# Patient Record
Sex: Male | Born: 1960 | Race: White | Hispanic: No | Smoking: Never smoker
Health system: Southern US, Community
[De-identification: ages and names within clinical notes are randomized; demographics above are authoritative.]

## PROBLEM LIST (undated history)

## (undated) DIAGNOSIS — I4891 Unspecified atrial fibrillation: Secondary | ICD-10-CM

## (undated) DIAGNOSIS — I10 Essential (primary) hypertension: Secondary | ICD-10-CM

## (undated) DIAGNOSIS — T7840XA Allergy, unspecified, initial encounter: Secondary | ICD-10-CM

## (undated) HISTORY — DX: Essential (primary) hypertension: I10

## (undated) HISTORY — DX: Allergy, unspecified, initial encounter: T78.40XA

## (undated) HISTORY — PX: VASECTOMY: SHX75

## (undated) HISTORY — DX: Unspecified atrial fibrillation: I48.91

---

## 2007-03-16 ENCOUNTER — Ambulatory Visit: Payer: Self-pay | Admitting: Chiropractic Medicine

## 2008-04-20 ENCOUNTER — Ambulatory Visit: Payer: Self-pay | Admitting: General Practice

## 2010-02-18 ENCOUNTER — Emergency Department: Payer: Self-pay | Admitting: Internal Medicine

## 2013-05-30 ENCOUNTER — Ambulatory Visit: Payer: Self-pay | Admitting: Gastroenterology

## 2013-05-31 LAB — PATHOLOGY REPORT

## 2013-09-14 HISTORY — PX: CERVICAL FUSION: SHX112

## 2014-03-28 ENCOUNTER — Ambulatory Visit: Payer: Self-pay | Admitting: General Practice

## 2014-07-17 ENCOUNTER — Ambulatory Visit: Payer: Self-pay | Admitting: General Practice

## 2019-05-10 ENCOUNTER — Other Ambulatory Visit: Payer: Self-pay | Admitting: Internal Medicine

## 2019-05-10 ENCOUNTER — Encounter: Payer: Self-pay | Admitting: Internal Medicine

## 2019-05-10 DIAGNOSIS — J301 Allergic rhinitis due to pollen: Secondary | ICD-10-CM

## 2019-05-10 DIAGNOSIS — I10 Essential (primary) hypertension: Secondary | ICD-10-CM

## 2019-05-10 MED ORDER — METOPROLOL SUCCINATE ER 100 MG PO TB24: 100.0000 mg | ORAL_TABLET | Freq: Two times a day (BID) | ORAL | 0 refills | Status: DC

## 2019-05-10 NOTE — Telephone Encounter (Signed)
Patient last seen by Dr Cheryll Cockayne 03/08/2018 overdue followup.  Was ordered repeat labs and needs repeat BP.  Please verify if patient seeing another PCM or had not come in due to covid 19 pandemic.  Bridge refill 30 days provided today electronic Rx for his singulair and metoprolol until he can schedule follow up appt.

## 2019-08-15 ENCOUNTER — Other Ambulatory Visit: Payer: Self-pay | Admitting: Registered Nurse

## 2019-08-15 DIAGNOSIS — H524 Presbyopia: Secondary | ICD-10-CM | POA: Diagnosis not present

## 2019-08-24 NOTE — Telephone Encounter (Signed)
Bridge refill given last request this fall. Patient overdue for labs/BP check.  Please schedule patient appt or confirm if he has new PCM and forward request to new PCM.  I will bridge refill to his scheduled appt.  Paper chart review last appt with Dr Cheryll Cockayne 2019

## 2019-09-13 NOTE — Telephone Encounter (Signed)
Richardson Landry was scheduled for labs & physical early part of January 2021.  Called yesterday & had to reschedule. Labs = 10/05/2019 Physical = 10/05/2019  AMD

## 2019-09-13 NOTE — Telephone Encounter (Signed)
Noted bridge refill of medications given electronic rx to his pharmacy of choice until appt date.

## 2019-10-17 ENCOUNTER — Other Ambulatory Visit: Payer: Self-pay | Admitting: Registered Nurse

## 2019-10-17 ENCOUNTER — Encounter: Payer: Self-pay | Admitting: Registered Nurse

## 2019-10-17 ENCOUNTER — Other Ambulatory Visit: Payer: Self-pay

## 2019-10-17 ENCOUNTER — Encounter: Payer: Self-pay | Admitting: Family Medicine

## 2019-10-17 ENCOUNTER — Ambulatory Visit (INDEPENDENT_AMBULATORY_CARE_PROVIDER_SITE_OTHER): Payer: Managed Care, Other (non HMO) | Admitting: Family Medicine

## 2019-10-17 VITALS — BP 187/86 | HR 93 | Temp 97.5°F | Ht 68.0 in | Wt 179.0 lb

## 2019-10-17 DIAGNOSIS — I4891 Unspecified atrial fibrillation: Secondary | ICD-10-CM | POA: Diagnosis not present

## 2019-10-17 DIAGNOSIS — Z23 Encounter for immunization: Secondary | ICD-10-CM

## 2019-10-17 DIAGNOSIS — I10 Essential (primary) hypertension: Secondary | ICD-10-CM

## 2019-10-17 DIAGNOSIS — Z1211 Encounter for screening for malignant neoplasm of colon: Secondary | ICD-10-CM | POA: Diagnosis not present

## 2019-10-17 DIAGNOSIS — Z125 Encounter for screening for malignant neoplasm of prostate: Secondary | ICD-10-CM

## 2019-10-17 DIAGNOSIS — J309 Allergic rhinitis, unspecified: Secondary | ICD-10-CM

## 2019-10-17 DIAGNOSIS — Z0000 Encounter for general adult medical examination without abnormal findings: Secondary | ICD-10-CM

## 2019-10-17 LAB — IFOBT (OCCULT BLOOD): IFOBT: NEGATIVE

## 2019-10-17 MED ORDER — MONTELUKAST SODIUM 10 MG PO TABS: 10.0000 mg | ORAL_TABLET | Freq: Every day | ORAL | 3 refills | Status: DC

## 2019-10-17 MED ORDER — METOPROLOL SUCCINATE ER 100 MG PO TB24: 100.0000 mg | ORAL_TABLET | Freq: Two times a day (BID) | ORAL | 3 refills | Status: DC

## 2019-10-17 MED ORDER — AMLODIPINE BESYLATE 5 MG PO TABS: 5.0000 mg | ORAL_TABLET | Freq: Every day | ORAL | 0 refills | Status: DC

## 2019-10-17 NOTE — Telephone Encounter (Signed)
Noted refilled entered by Dr Rosanna Randy today singulair 10mg  po qhs #90 RF3 for patient.  This is duplicate request.

## 2019-10-17 NOTE — Progress Notes (Signed)
Patient: Gabriel Harvey, Male    DOB: 1961-05-10, 59 y.o.   MRN: [id number] Visit Date: 10/17/2019  Today's Provider: [name], MD   Chief Complaint  Patient presents with  . New Patient (Initial Visit)   Subjective:     Annual physical exam Gabriel Harvey is a 59 y.o. male who presents today for health maintenance and complete physical. He feels well. He reports he is not exercising. He reports he is sleeping well. 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  Is divorced.  He has 3 children ages 2925 and 39 with no grandchildren. He has had 1 colonoscopy in the past. ----------------------------------------------------------------- Patient needs refills on metoprolol and montelukast   Review of Systems  Constitutional: Negative.   HENT: Positive for congestion, dental problem, hearing loss, rhinorrhea, sinus pressure and tinnitus.   Eyes: Negative.   Respiratory: Positive for apnea.   Cardiovascular: Negative.   Gastrointestinal: Positive for abdominal distention.  Endocrine: Positive for polyuria.  Musculoskeletal: Positive for arthralgias, neck pain and neck stiffness.  Skin: Negative.   Allergic/Immunologic: Positive for environmental allergies.  Neurological: Positive for numbness.  Hematological: Bruises/bleeds easily.  Psychiatric/Behavioral: Negative.     Social History      He         Social History   Socioeconomic History  . Marital status: Divorced    Spouse name: Not on file  . Number of children: Not on file  . Years of education: Not on file  . Highest education level: Not on file  Occupational History  . Not on file  Tobacco Use  . Smoking status: Not on file  Substance and Sexual Activity  . Alcohol use: Not on file  . Drug use: Not on file  . Sexual activity: Not on file  Other Topics Concern  . Not on file  Social History Narrative  . Not on file   Social Determinants of Health   Financial  Resource Strain:   . Difficulty of Paying Living Expenses: Not on file  Food Insecurity:   . Worried About Charity fundraiser in the Last Year: Not on file  . Ran Out of Food in the Last Year: Not on file  Transportation Needs:   . Lack of Transportation (Medical): Not on file  . Lack of Transportation (Non-Medical): Not on file  Physical Activity:   . Days of Exercise per Week: Not on file  . Minutes of Exercise per Session: Not on file  Stress:   . Feeling of Stress : Not on file  Social Connections:   . Frequency of Communication with Friends and Family: Not on file  . Frequency of Social Gatherings with Friends and Family: Not on file  . Attends Religious Services: Not on file  . Active Member of Clubs or Organizations: Not on file  . Attends Archivist Meetings: Not on file  . Marital Status: Not on file    No past medical history on file.   There are no problems to display for this patient.   No past surgical history on file.  Family History        No family status information on file.        His family history is not on file.      Allergies  Allergen Reactions  . Augmentin [Amoxicillin-Pot Clavulanate] Other (See Comments)    headache  . Iodine     Child  allergy     Current Outpatient Medications:  .  metoprolol succinate (TOPROL-XL) 100 MG 24 hr tablet, TAKE ONE TABLET TWICE DAILY, Disp: 60 tablet, Rfl: 0 .  montelukast (SINGULAIR) 10 MG tablet, TAKE ONE TABLET AT BEDTIME, Disp: 30 tablet, Rfl: 0   Patient Care Team: Jerrol Banana., MD as PCP - General (Family Medicine)    Objective:    Vitals: There were no vitals taken for this visit.  There were no vitals filed for this visit.   Physical Exam Vitals reviewed.  HENT:     Head: Normocephalic and atraumatic.     Right Ear: External ear normal.     Left Ear: External ear normal.  Eyes:     General: No scleral icterus.    Conjunctiva/sclera: Conjunctivae normal.  Neck:      Vascular: No carotid bruit.  Cardiovascular:     Rate and Rhythm: Normal rate and regular rhythm.     Pulses: Normal pulses.     Heart sounds: Normal heart sounds.  Pulmonary:     Effort: Pulmonary effort is normal.     Breath sounds: Normal breath sounds.  Abdominal:     Palpations: Abdomen is soft.  Musculoskeletal:     Right lower leg: No edema.     Left lower leg: No edema.  Lymphadenopathy:     Cervical: No cervical adenopathy.  Skin:    General: Skin is warm and dry.  Neurological:     General: No focal deficit present.     Mental Status: He is alert and oriented to person, place, and time.  Psychiatric:        Mood and Affect: Mood normal.        Behavior: Behavior normal.        Thought Content: Thought content normal.        Judgment: Judgment normal.      Depression Screen No flowsheet data found.     Assessment & Plan:     Routine Health Maintenance and Physical Exam Refer for colonoscopy. Exercise Activities and Dietary recommendations Goals   None      There is no immunization history on file for this patient.  Health Maintenance  Topic Date Due  . Hepatitis C Screening  [birth year]  . HIV Screening  10/24/1973  . TETANUS/TDAP  10/24/1977  . COLONOSCOPY  10/24/2008  . INFLUENZA VACCINE  04/15/2019     Discussed health benefits of physical activity, and encouraged him to engage in regular exercise appropriate for his age and condition.   1. Annual physical exam   2. Atrial fibrillation, unspecified type ([place])  - EKG 12-Lead - CBC with Differential/Platelet - Comprehensive metabolic panel - TSH - Lipid panel  3. Need for Tdap vaccination  - EKG 12-Lead - CBC with Differential/Platelet - Comprehensive metabolic panel - TSH - Lipid panel  4. Hypertension, unspecified type Double dose of medication.  Turn to clinic 1 to 2 months - EKG 12-Lead - CBC with Differential/Platelet - Comprehensive metabolic panel - TSH - Lipid  panel - metoprolol succinate (TOPROL-XL) 100 MG 24 hr tablet; Take 1 tablet (100 mg total) by mouth 2 (two) times daily. Take with or immediately following a meal.  Dispense: 180 tablet; Refill: 3  5. Allergic rhinitis, unspecified seasonality, unspecified trigger  - EKG 12-Lead - CBC with Differential/Platelet - Comprehensive metabolic panel - TSH - Lipid panel - montelukast (SINGULAIR) 10 MG tablet; Take 1 tablet (10 mg total) by mouth at bedtime.  Dispense: 90 tablet; Refill: 3  6. Screening for prostate cancer  - EKG 12-Lead - CBC with Differential/Platelet - Comprehensive metabolic panel - TSH - Lipid panel - PSA  7. Encounter for screening fecal occult blood testing Refer for colonoscopy. - IFOBT POC (occult bld, rslt in office); Future - IFOBT POC (occult bld, rslt in office) 8.Status post cervical laminectomy --------------------------------------------------------------------    [name], MD  [facility]

## 2019-11-15 NOTE — Progress Notes (Deleted)
       Patient: Gabriel Harvey Male    DOB: 1960-09-27   59 y.o.   MRN: [id number] Visit Date: 11/15/2019  Today's Provider: [name], MD   No chief complaint on file.  Subjective:     HPI   Atrial fibrillation, unspecified type ([place]) From 10/17/2019-labs ordered, but zero were done.  Hypertension, unspecified type From 10/17/2019-labs ordered, but zero were done. Double dose of medication.  follow up in 1 to 2 months.   Allergies  Allergen Reactions  . Augmentin [Amoxicillin-Pot Clavulanate] Other (See Comments)    headache  . Iodine     Child allergy     Current Outpatient Medications:  .  amLODipine (NORVASC) 5 MG tablet, Take 1 tablet (5 mg total) by mouth daily., Disp: 30 tablet, Rfl: 0 .  aspirin EC 81 MG tablet, Take 81 mg by mouth daily. As needed, Disp: , Rfl:  .  diltiazem (CARDIZEM) 30 MG tablet, Take 30 mg by mouth 1 day or 1 dose., Disp: , Rfl:  .  fexofenadine (ALLEGRA) 180 MG tablet, Take 180 mg by mouth daily., Disp: , Rfl:  .  metoprolol succinate (TOPROL-XL) 100 MG 24 hr tablet, Take 1 tablet (100 mg total) by mouth 2 (two) times daily. Take with or immediately following a meal., Disp: 180 tablet, Rfl: 3 .  montelukast (SINGULAIR) 10 MG tablet, Take 1 tablet (10 mg total) by mouth at bedtime., Disp: 90 tablet, Rfl: 3  Review of Systems  Constitutional: Negative for appetite change, chills and fever.  Respiratory: Negative for chest tightness, shortness of breath and wheezing.   Cardiovascular: Negative for chest pain and palpitations.  Gastrointestinal: Negative for abdominal pain, nausea and vomiting.    Social History   Tobacco Use  . Smoking status: Never Smoker  . Smokeless tobacco: Current User    Types: Chew  Substance Use Topics  . Alcohol use: Yes    Alcohol/week: 2.0 standard drinks    Types: 2 Cans of beer per week    Comment: occasional       Objective:   There were no vitals taken for this visit. There were no vitals  filed for this visit.There is no height or weight on file to calculate BMI.   Physical Exam   No results found for any visits on 11/16/19.     Assessment & Plan        [name], MD  [facility]

## 2019-11-16 ENCOUNTER — Ambulatory Visit: Payer: Self-pay | Admitting: Family Medicine

## 2019-11-21 ENCOUNTER — Other Ambulatory Visit: Payer: Self-pay | Admitting: Family Medicine

## 2019-11-21 DIAGNOSIS — I4891 Unspecified atrial fibrillation: Secondary | ICD-10-CM | POA: Diagnosis not present

## 2019-11-21 DIAGNOSIS — I10 Essential (primary) hypertension: Secondary | ICD-10-CM | POA: Diagnosis not present

## 2019-11-21 DIAGNOSIS — J309 Allergic rhinitis, unspecified: Secondary | ICD-10-CM | POA: Diagnosis not present

## 2019-11-21 DIAGNOSIS — Z125 Encounter for screening for malignant neoplasm of prostate: Secondary | ICD-10-CM | POA: Diagnosis not present

## 2019-11-21 DIAGNOSIS — Z23 Encounter for immunization: Secondary | ICD-10-CM | POA: Diagnosis not present

## 2019-11-21 NOTE — Progress Notes (Signed)
Patient: Gabriel Harvey Male    DOB: 11-20-1960   59 y.o.   MRN: [id number] Visit Date: 11/23/2019  Today's Provider: [name], MD   Chief Complaint  Patient presents with  . Hypertension   Subjective:     HPI  Patient feels well.  Is tolerating amlodipine and metoprolol.  Overall he feels well.  Not exercising. Hypertension, unspecified type From 10/17/2019-Double dose of medication, metoprolol 100 mg. Follow up in 1 to 2 months. Labs ordered, but not done until 11/21/2019.   Allergies  Allergen Reactions  . Augmentin [Amoxicillin-Pot Clavulanate] Other (See Comments)    headache  . Iodine     Child allergy     Current Outpatient Medications:  .  amLODipine (NORVASC) 5 MG tablet, TAKE ONE TABLET EVERY DAY, Disp: 30 tablet, Rfl: 0 .  aspirin EC 81 MG tablet, Take 81 mg by mouth daily. As needed, Disp: , Rfl:  .  diltiazem (CARDIZEM) 30 MG tablet, Take 30 mg by mouth 1 day or 1 dose., Disp: , Rfl:  .  fexofenadine (ALLEGRA) 180 MG tablet, Take 180 mg by mouth daily., Disp: , Rfl:  .  metoprolol succinate (TOPROL-XL) 100 MG 24 hr tablet, Take 1 tablet (100 mg total) by mouth 2 (two) times daily. Take with or immediately following a meal., Disp: 180 tablet, Rfl: 3 .  montelukast (SINGULAIR) 10 MG tablet, Take 1 tablet (10 mg total) by mouth at bedtime., Disp: 90 tablet, Rfl: 3  Review of Systems  Constitutional: Negative for appetite change, chills and fever.  HENT: Negative.   Eyes: Negative.   Respiratory: Negative for chest tightness, shortness of breath and wheezing.   Cardiovascular: Negative for chest pain and palpitations.  Gastrointestinal: Negative for abdominal pain, nausea and vomiting.  Endocrine: Negative.   Musculoskeletal: Positive for arthralgias and back pain.  Allergic/Immunologic: Negative.   Neurological: Negative.   Hematological: Negative.   Psychiatric/Behavioral: Negative.     Social History   Tobacco Use  . Smoking status:  Never Smoker  . Smokeless tobacco: Current User    Types: Chew  Substance Use Topics  . Alcohol use: Yes    Alcohol/week: 2.0 standard drinks    Types: 2 Cans of beer per week    Comment: occasional       Objective:   BP (!) 157/86 (BP Location: Left Arm, Cuff Size: Large)   Pulse (!) 108   Temp (!) 97.2 F (36.2 C) (Temporal)   Resp 17   Wt 176 lb 3.2 oz (79.9 kg)   SpO2 96%   BMI 26.79 kg/m  Vitals:   11/23/19 1334 11/23/19 1340  BP: (!) 172/90 (!) 157/86  Pulse: (!) 111 (!) 108  Resp: 17   Temp: (!) 97.2 F (36.2 C)   TempSrc: Temporal   SpO2: 96%   Weight: 176 lb 3.2 oz (79.9 kg)   Body mass index is 26.79 kg/m.   Physical Exam Vitals reviewed.  HENT:     Head: Normocephalic and atraumatic.     Right Ear: External ear normal.     Left Ear: External ear normal.  Eyes:     General: No scleral icterus.    Conjunctiva/sclera: Conjunctivae normal.  Neck:     Vascular: No carotid bruit.  Cardiovascular:     Rate and Rhythm: Normal rate and regular rhythm.     Pulses: Normal pulses.     Heart sounds: Normal heart sounds.  Pulmonary:  Effort: Pulmonary effort is normal.     Breath sounds: Normal breath sounds.  Abdominal:     Palpations: Abdomen is soft.  Musculoskeletal:     Right lower leg: No edema.     Left lower leg: No edema.  Lymphadenopathy:     Cervical: No cervical adenopathy.  Skin:    General: Skin is warm and dry.  Neurological:     General: No focal deficit present.     Mental Status: He is alert and oriented to person, place, and time.  Psychiatric:        Mood and Affect: Mood normal.        Behavior: Behavior normal.        Thought Content: Thought content normal.        Judgment: Judgment normal.      No results found for any visits on 11/23/19.     Assessment & Plan    1. Hypertension, unspecified type Increase amlodipine from 5 mg to 10 mg daily. Return to clinic 2 to 3 months - amLODipine (NORVASC) 10 MG tablet;  Take 1 tablet (10 mg total) by mouth daily.  Dispense: 30 tablet; Refill: 3 2.Afib   Follow up in 2-3 months.      I,Claritza July,acting as a scribe for [name], MD.,have documented all relevant documentation on the behalf of [name], MD,as directed by  [name], MD while in the presence of [name], MD.     [name], MD  [facility]

## 2019-11-22 LAB — LIPID PANEL
Chol/HDL Ratio: 1.8 ratio (ref 0.0–5.0)
Cholesterol, Total: 168 mg/dL (ref 100–199)
HDL: 91 mg/dL (ref >39)
LDL Chol Calc (NIH): 66 mg/dL (ref 0–99)
Triglycerides: 54 mg/dL (ref 0–149)
VLDL Cholesterol Cal: 11 mg/dL (ref 5–40)

## 2019-11-22 LAB — CBC WITH DIFFERENTIAL/PLATELET
Basophils Absolute: 0 x10E3/uL (ref 0.0–0.2)
Basos: 1 %
EOS (ABSOLUTE): 0.1 x10E3/uL (ref 0.0–0.4)
Eos: 1 %
Hematocrit: 45.6 % (ref 37.5–51.0)
Hemoglobin: 15.6 g/dL (ref 13.0–17.7)
Immature Grans (Abs): 0 x10E3/uL (ref 0.0–0.1)
Immature Granulocytes: 1 %
Lymphocytes Absolute: 1.1 x10E3/uL (ref 0.7–3.1)
Lymphs: 21 %
MCH: 32 pg (ref 26.6–33.0)
MCHC: 34.2 g/dL (ref 31.5–35.7)
MCV: 94 fL (ref 79–97)
Monocytes Absolute: 0.6 x10E3/uL (ref 0.1–0.9)
Monocytes: 11 %
Neutrophils Absolute: 3.5 x10E3/uL (ref 1.4–7.0)
Neutrophils: 65 %
Platelets: 132 x10E3/uL — ABNORMAL LOW (ref 150–450)
RBC: 4.87 x10E6/uL (ref 4.14–5.80)
RDW: 11.6 % (ref 11.6–15.4)
WBC: 5.3 x10E3/uL (ref 3.4–10.8)

## 2019-11-22 LAB — COMPREHENSIVE METABOLIC PANEL
ALT: 49 IU/L — ABNORMAL HIGH (ref 0–44)
AST: 63 IU/L — ABNORMAL HIGH (ref 0–40)
Albumin/Globulin Ratio: 1.6 (ref 1.2–2.2)
Albumin: 4.2 g/dL (ref 3.8–4.8)
Alkaline Phosphatase: 104 IU/L (ref 39–117)
BUN/Creatinine Ratio: 7 — ABNORMAL LOW (ref 10–24)
BUN: 6 mg/dL — ABNORMAL LOW (ref 8–27)
Bilirubin Total: 0.8 mg/dL (ref 0.0–1.2)
CO2: 23 mmol/L (ref 20–29)
Calcium: 8.9 mg/dL (ref 8.6–10.2)
Chloride: 93 mmol/L — ABNORMAL LOW (ref 96–106)
Creatinine, Ser: 0.9 mg/dL (ref 0.76–1.27)
GFR calc Af Amer: 106 mL/min/{1.73_m2} (ref >59)
GFR calc non Af Amer: 92 mL/min/{1.73_m2} (ref >59)
Globulin, Total: 2.7 g/dL (ref 1.5–4.5)
Glucose: 108 mg/dL — ABNORMAL HIGH (ref 65–99)
Potassium: 3.9 mmol/L (ref 3.5–5.2)
Sodium: 135 mmol/L (ref 134–144)
Total Protein: 6.9 g/dL (ref 6.0–8.5)

## 2019-11-22 LAB — PSA: Prostate Specific Ag, Serum: 1.4 ng/mL (ref 0.0–4.0)

## 2019-11-22 LAB — TSH: TSH: 3.74 u[IU]/mL (ref 0.450–4.500)

## 2019-11-23 ENCOUNTER — Ambulatory Visit (INDEPENDENT_AMBULATORY_CARE_PROVIDER_SITE_OTHER): Payer: 59 | Admitting: Family Medicine

## 2019-11-23 ENCOUNTER — Encounter: Payer: Self-pay | Admitting: Family Medicine

## 2019-11-23 ENCOUNTER — Other Ambulatory Visit: Payer: Self-pay

## 2019-11-23 VITALS — BP 157/86 | HR 108 | Temp 97.2°F | Resp 17 | Wt 176.2 lb

## 2019-11-23 DIAGNOSIS — I10 Essential (primary) hypertension: Secondary | ICD-10-CM | POA: Diagnosis not present

## 2019-11-23 DIAGNOSIS — I4891 Unspecified atrial fibrillation: Secondary | ICD-10-CM | POA: Diagnosis not present

## 2019-11-23 MED ORDER — AMLODIPINE BESYLATE 10 MG PO TABS: 10.0000 mg | ORAL_TABLET | Freq: Every day | ORAL | 3 refills | Status: DC

## 2020-01-18 NOTE — Progress Notes (Signed)
Established patient visit   Patient: Gabriel Harvey   DOB: 1961/07/05   59 y.o. Male  MRN: [id number] Visit Date: 01/23/2020  Today's healthcare provider: [name], MD   Chief Complaint  Patient presents with  . Follow-up  . Hypertension   Subjective    HPI  Hypertension, follow-up  BP Readings from Last 3 Encounters:  01/23/20 (!) 155/82  11/23/19 (!) 157/86  10/17/19 (!) 187/86   Wt Readings from Last 3 Encounters:  01/23/20 174 lb (78.9 kg)  11/23/19 176 lb 3.2 oz (79.9 kg)  10/17/19 179 lb (81.2 kg)     He was last seen for hypertension 2 months ago.  BP at that visit was 157/86. Management since that visit includes ;Increased amlodipine from 5 mg to 10 mg daily.  He reports good compliance with treatment. He is not having side effects. none He some exercising. He is not adherent to low salt diet.   Outside blood pressures are not checking.  He does not smoke.  Use of agents associated with hypertension: NSAIDS.   --------------------------------------------------------------------       Medications: Outpatient Medications Prior to Visit  Medication Sig  . amLODipine (NORVASC) 10 MG tablet Take 1 tablet (10 mg total) by mouth daily.  Marland Kitchen aspirin EC 81 MG tablet Take 81 mg by mouth daily. As needed  . diltiazem (CARDIZEM) 30 MG tablet Take 30 mg by mouth 1 day or 1 dose.  . fexofenadine (ALLEGRA) 180 MG tablet Take 180 mg by mouth daily.  . metoprolol succinate (TOPROL-XL) 100 MG 24 hr tablet Take 1 tablet (100 mg total) by mouth 2 (two) times daily. Take with or immediately following a meal.  . montelukast (SINGULAIR) 10 MG tablet Take 1 tablet (10 mg total) by mouth at bedtime.   No facility-administered medications prior to visit.    Review of Systems  Constitutional: Negative for appetite change, chills and fever.  Eyes: Negative.   Respiratory: Negative for chest tightness, shortness of breath and wheezing.   Cardiovascular:  Negative for chest pain and palpitations.  Gastrointestinal: Negative for abdominal pain, nausea and vomiting.  Endocrine: Negative.   Musculoskeletal: Positive for back pain.  Allergic/Immunologic: Negative.   Neurological: Negative.   Hematological: Negative.   Psychiatric/Behavioral: Negative.        Objective    BP (!) 155/82 (BP Location: Right Arm, Patient Position: Sitting, Cuff Size: Large)   Pulse 92   Temp 97.7 F (36.5 C) (Other (Comment))   Resp 18   Ht 5\' 8"  (1.727 m)   Wt 174 lb (78.9 kg)   SpO2 96%   BMI 26.46 kg/m  BP Readings from Last 3 Encounters:  01/23/20 (!) 155/82  11/23/19 (!) 157/86  10/17/19 (!) 187/86   Wt Readings from Last 3 Encounters:  01/23/20 174 lb (78.9 kg)  11/23/19 176 lb 3.2 oz (79.9 kg)  10/17/19 179 lb (81.2 kg)      Physical Exam Vitals reviewed.  HENT:     Head: Normocephalic and atraumatic.     Right Ear: External ear normal.     Left Ear: External ear normal.  Eyes:     General: No scleral icterus.    Conjunctiva/sclera: Conjunctivae normal.  Neck:     Vascular: No carotid bruit.  Cardiovascular:     Rate and Rhythm: Normal rate and regular rhythm.     Pulses: Normal pulses.     Heart sounds: Normal heart sounds.  Pulmonary:  Effort: Pulmonary effort is normal.     Breath sounds: Normal breath sounds.  Abdominal:     Palpations: Abdomen is soft.  Musculoskeletal:     Right lower leg: No edema.     Left lower leg: No edema.  Lymphadenopathy:     Cervical: No cervical adenopathy.  Skin:    General: Skin is warm and dry.  Neurological:     General: No focal deficit present.     Mental Status: He is alert and oriented to person, place, and time.  Psychiatric:        Mood and Affect: Mood normal.        Behavior: Behavior normal.        Thought Content: Thought content normal.        Judgment: Judgment normal.       No results found for any visits on 01/23/20.  Assessment & Plan     1. Essential  hypertension He should work on exercise. Get blood pressure readings from home and reassess in 2 months.  2. Atrial fibrillation, unspecified type ([place]) As needed diltiazem.   No follow-ups on file.      I, [name], MD, have reviewed all documentation for this visit. The documentation on 01/28/20 for the exam, diagnosis, procedures, and orders are all accurate and complete.    Taviana Westergren Cranford Mon, MD  [facility] [phone] (phone) [phone] (fax)  [place]

## 2020-01-23 ENCOUNTER — Ambulatory Visit (INDEPENDENT_AMBULATORY_CARE_PROVIDER_SITE_OTHER): Payer: 59 | Admitting: Family Medicine

## 2020-01-23 ENCOUNTER — Other Ambulatory Visit: Payer: Self-pay

## 2020-01-23 ENCOUNTER — Encounter: Payer: Self-pay | Admitting: Family Medicine

## 2020-01-23 VITALS — BP 155/82 | HR 92 | Temp 97.7°F | Resp 18 | Ht 68.0 in | Wt 174.0 lb

## 2020-01-23 DIAGNOSIS — I10 Essential (primary) hypertension: Secondary | ICD-10-CM | POA: Diagnosis not present

## 2020-01-23 DIAGNOSIS — I4891 Unspecified atrial fibrillation: Secondary | ICD-10-CM | POA: Diagnosis not present

## 2020-03-19 ENCOUNTER — Other Ambulatory Visit: Payer: Self-pay | Admitting: Family Medicine

## 2020-03-19 DIAGNOSIS — I10 Essential (primary) hypertension: Secondary | ICD-10-CM

## 2020-03-19 NOTE — Telephone Encounter (Signed)
Requested Prescriptions  Pending Prescriptions Disp Refills  . amLODipine (NORVASC) 10 MG tablet [Pharmacy Med Name: AMLODIPINE BESYLATE 10 MG TAB] 90 tablet 1    Sig: TAKE 1 TABLET BY MOUTH DAILY     Cardiovascular:  Calcium Channel Blockers Failed - 03/19/2020  7:03 PM      Failed - Last BP in normal range    BP Readings from Last 1 Encounters:  01/23/20 (!) 155/82         Passed - Valid encounter within last 6 months    Recent Outpatient Visits          1 month ago Essential hypertension   [facility] Jerrol Banana., MD   3 months ago Hypertension, unspecified type   [facility] Jerrol Banana., MD   5 months ago Annual physical exam   [facility] Jerrol Banana., MD      Future Appointments            In 1 month Jerrol Banana., MD [facility], [place]

## 2020-04-23 NOTE — Progress Notes (Signed)
I,[name],acting as a scribe for Gabriel Durie, MD.,have documented all relevant documentation on the behalf of Gabriel Durie, MD,as directed by  Gabriel Durie, MD while in the presence of Gabriel Durie, MD.   Established patient visit   Patient: Gabriel Harvey   DOB: Sep 23, 1960   59 y.o. Male  MRN: [id number] Visit Date: 04/24/2020  Today's healthcare provider: [name], MD   Chief Complaint  Patient presents with  . Follow-up  . Hypertension   Subjective    HPI  Patient is taking his medications as prescribed.  He is not exercising regularly. Hypertension, follow-up  BP Readings from Last 3 Encounters:  04/24/20 (!) 155/88  01/23/20 (!) 155/82  11/23/19 (!) 157/86   Wt Readings from Last 3 Encounters:  04/24/20 175 lb (79.4 kg)  01/23/20 174 lb (78.9 kg)  11/23/19 176 lb 3.2 oz (79.9 kg)     He was last seen for hypertension 3 months ago.  BP at that visit was 155/82. Management since that visit includes; He should work on exercise. Get blood pressure readings from home and reassess in 2 months. He reports good compliance with treatment. He is not having side effects. none He is exercising. He is adherent to low salt diet.   Outside blood pressures are 120's/70's-140's/90's.  He does not smoke.  Use of agents associated with hypertension: none.   --------------------------------------------------------------------  Atrial fibrillation, unspecified type ([place]) From 01/23/2020-As needed diltiazem.       Medications: Outpatient Medications Prior to Visit  Medication Sig  . amLODipine (NORVASC) 10 MG tablet TAKE 1 TABLET BY MOUTH DAILY  . aspirin EC 81 MG tablet Take 81 mg by mouth daily. As needed  . diltiazem (CARDIZEM) 30 MG tablet Take 30 mg by mouth 1 day or 1 dose.  . fexofenadine (ALLEGRA) 180 MG tablet Take 180 mg by mouth daily.  . metoprolol succinate (TOPROL-XL) 100 MG 24 hr tablet Take 1 tablet (100 mg total)  by mouth 2 (two) times daily. Take with or immediately following a meal.  . montelukast (SINGULAIR) 10 MG tablet Take 1 tablet (10 mg total) by mouth at bedtime.   No facility-administered medications prior to visit.    Review of Systems  Constitutional: Negative for appetite change, chills and fever.  Respiratory: Negative for chest tightness, shortness of breath and wheezing.   Cardiovascular: Negative for chest pain and palpitations.  Gastrointestinal: Negative for abdominal pain, nausea and vomiting.       Objective    BP (!) 155/88 (BP Location: Right Arm, Patient Position: Sitting, Cuff Size: Large)   Pulse 84   Temp 98.6 F (37 C) (Other (Comment))   Resp 18   Ht 5\' 8"  (1.727 m)   Wt 175 lb (79.4 kg)   SpO2 95%   BMI 26.61 kg/m  BP Readings from Last 3 Encounters:  04/24/20 (!) 155/88  01/23/20 (!) 155/82  11/23/19 (!) 157/86   Wt Readings from Last 3 Encounters:  04/24/20 175 lb (79.4 kg)  01/23/20 174 lb (78.9 kg)  11/23/19 176 lb 3.2 oz (79.9 kg)      Physical Exam Vitals reviewed.  HENT:     Head: Normocephalic and atraumatic.     Right Ear: External ear normal.     Left Ear: External ear normal.  Eyes:     General: No scleral icterus.    Conjunctiva/sclera: Conjunctivae normal.  Neck:     Vascular: No carotid bruit.  Cardiovascular:     Rate and Rhythm: Normal rate and regular rhythm.     Pulses: Normal pulses.     Heart sounds: Normal heart sounds.  Pulmonary:     Effort: Pulmonary effort is normal.     Breath sounds: Normal breath sounds.  Abdominal:     Palpations: Abdomen is soft.  Musculoskeletal:     Right lower leg: No edema.     Left lower leg: No edema.  Lymphadenopathy:     Cervical: No cervical adenopathy.  Skin:    General: Skin is warm and dry.  Neurological:     General: No focal deficit present.     Mental Status: He is alert and oriented to person, place, and time.  Psychiatric:        Mood and Affect: Mood normal.         Behavior: Behavior normal.        Thought Content: Thought content normal.        Judgment: Judgment normal.     BP (!) 155/88 (BP Location: Right Arm, Patient Position: Sitting, Cuff Size: Large)   Pulse 84   Temp 98.6 F (37 C) (Other (Comment))   Resp 18   Ht 5\' 8"  (1.727 m)   Wt 175 lb (79.4 kg)   SpO2 95%   BMI 26.61 kg/m   General Appearance:    Alert, cooperative, no distress, appears stated age  Head:    Normocephalic, without obvious abnormality, atraumatic  Eyes:    PERRL, conjunctiva/corneas clear, EOM's intact, fundi    benign, both eyes       Ears:    Normal TM's and external ear canals, both ears  Nose:   Nares normal, septum midline, mucosa normal, no drainage   or sinus tenderness  Throat:   Lips, mucosa, and tongue normal; teeth and gums normal  Neck:   Supple, symmetrical, trachea midline, no adenopathy;       thyroid:  No enlargement/tenderness/nodules; no carotid   bruit or JVD  Back:     Symmetric, no curvature, ROM normal, no CVA tenderness  Lungs:     Clear to auscultation bilaterally, respirations unlabored  Chest wall:    No tenderness or deformity  Heart:    Regular rate and rhythm, S1 and S2 normal, no murmur, rub   or gallop  Abdomen:     Soft, non-tender, bowel sounds active all four quadrants,    no masses, no organomegaly  Genitalia:    Normal male without lesion, discharge or tenderness  Rectal:    Normal tone, normal prostate, no masses or tenderness;   guaiac negative stool  Extremities:   Extremities normal, atraumatic, no cyanosis or edema  Pulses:   2+ and symmetric all extremities  Skin:   Skin color, texture, turgor normal, no rashes or lesions  Lymph nodes:   Cervical, supraclavicular, and axillary nodes normal  Neurologic:   CNII-XII intact. Normal strength, sensation and reflexes      throughout     No results found for any visits on 04/24/20.  Assessment & Plan     1. Essential hypertension Work on diet and exercise and  check blood pressure readings at home and follow-up with 3 months.   Return in about 3 months (around 07/25/2020).         Lavere Stork Cranford Mon, MD  [facility] [phone] (phone) [phone] (fax)  [place]

## 2020-04-24 ENCOUNTER — Ambulatory Visit (INDEPENDENT_AMBULATORY_CARE_PROVIDER_SITE_OTHER): Payer: 59 | Admitting: Family Medicine

## 2020-04-24 ENCOUNTER — Other Ambulatory Visit: Payer: Self-pay

## 2020-04-24 ENCOUNTER — Encounter: Payer: Self-pay | Admitting: Family Medicine

## 2020-04-24 VITALS — BP 155/88 | HR 84 | Temp 98.6°F | Resp 18 | Ht 68.0 in | Wt 175.0 lb

## 2020-04-24 DIAGNOSIS — I4891 Unspecified atrial fibrillation: Secondary | ICD-10-CM

## 2020-04-24 DIAGNOSIS — I10 Essential (primary) hypertension: Secondary | ICD-10-CM | POA: Diagnosis not present

## 2020-07-30 ENCOUNTER — Ambulatory Visit: Payer: Self-pay | Admitting: Family Medicine

## 2020-07-30 NOTE — Progress Notes (Deleted)
     Established patient visit   Patient: Gabriel Harvey   DOB: 03/30/1961   59 y.o. Male  MRN: [id number] Visit Date: 07/30/2020  Today's healthcare provider: [name], MD   No chief complaint on file.  Subjective    HPI  Hypertension, follow-up  BP Readings from Last 3 Encounters:  04/24/20 (!) 155/88  01/23/20 (!) 155/82  11/23/19 (!) 157/86   Wt Readings from Last 3 Encounters:  04/24/20 175 lb (79.4 kg)  01/23/20 174 lb (78.9 kg)  11/23/19 176 lb 3.2 oz (79.9 kg)     He was last seen for hypertension 3 months ago.  BP at that visit was 155/88. Management since that visit includes; Work on diet and exercise and check blood pressure readings at home and follow-up with 3 months. He reports {excellent/good/fair/poor:19665} compliance with treatment. He {is/is not:9024} having side effects. {document side effects if present:1} He {is/is not:9024} exercising. He {is/is not:9024} adherent to low salt diet.   Outside blood pressures are {enter patient reported home BP, or 'not being checked':1}.  He {does/does not:200015} smoke.  Use of agents associated with hypertension: {bp agents assoc with hypertension:511::"none"}.   ---------------------------------------------------------------------------------------------------   {Show patient history (optional):23778::" "}   Medications: Outpatient Medications Prior to Visit  Medication Sig  . amLODipine (NORVASC) 10 MG tablet TAKE 1 TABLET BY MOUTH DAILY  . aspirin EC 81 MG tablet Take 81 mg by mouth daily. As needed  . diltiazem (CARDIZEM) 30 MG tablet Take 30 mg by mouth 1 day or 1 dose.  . fexofenadine (ALLEGRA) 180 MG tablet Take 180 mg by mouth daily.  . metoprolol succinate (TOPROL-XL) 100 MG 24 hr tablet Take 1 tablet (100 mg total) by mouth 2 (two) times daily. Take with or immediately following a meal.  . montelukast (SINGULAIR) 10 MG tablet Take 1 tablet (10 mg total) by mouth at bedtime.   No  facility-administered medications prior to visit.    Review of Systems  Constitutional: Negative for appetite change, chills and fever.  Respiratory: Negative for chest tightness, shortness of breath and wheezing.   Cardiovascular: Negative for chest pain and palpitations.  Gastrointestinal: Negative for abdominal pain, nausea and vomiting.    {Heme  Chem  Endocrine  Serology  Results Review (optional):23779::" "}  Objective    There were no vitals taken for this visit. {Show previous vital signs (optional):23777::" "}  Physical Exam  ***  No results found for any visits on 07/30/20.  Assessment & Plan     ***  No follow-ups on file.      {provider attestation***:1}   [name], MD  [facility] [phone] (phone) [phone] (fax)  [place]

## 2020-08-16 DIAGNOSIS — L570 Actinic keratosis: Secondary | ICD-10-CM | POA: Diagnosis not present

## 2020-08-16 DIAGNOSIS — L718 Other rosacea: Secondary | ICD-10-CM | POA: Diagnosis not present

## 2020-10-22 ENCOUNTER — Other Ambulatory Visit: Payer: Self-pay | Admitting: Family Medicine

## 2020-10-22 DIAGNOSIS — I10 Essential (primary) hypertension: Secondary | ICD-10-CM

## 2020-10-23 ENCOUNTER — Other Ambulatory Visit: Payer: Self-pay | Admitting: Family Medicine

## 2020-10-23 DIAGNOSIS — I10 Essential (primary) hypertension: Secondary | ICD-10-CM

## 2020-11-01 ENCOUNTER — Other Ambulatory Visit: Payer: Self-pay | Admitting: Family Medicine

## 2020-11-01 DIAGNOSIS — I10 Essential (primary) hypertension: Secondary | ICD-10-CM

## 2020-11-01 NOTE — Telephone Encounter (Signed)
Patient called and advised he is past due for his f/u as noted last OV note, patient verbalized understanding. Appointment scheduled for Thursday, 11/14/20 at 1320.

## 2020-11-01 NOTE — Telephone Encounter (Signed)
Requested medication (s) are due for refill today: Yes  Requested medication (s) are on the active medication list: Yes  Last refill:  10/17/19  Future visit scheduled: Yes  Notes to clinic:  Unable to refill per protocol, Rx expired.      Requested Prescriptions  Pending Prescriptions Disp Refills   metoprolol succinate (TOPROL-XL) 100 MG 24 hr tablet [Pharmacy Med Name: METOPROLOL SUCCINATE ER 100 MG TAB] 180 tablet 3    Sig: TAKE 1 TABLET BY MOUTH TWICE DAILY . TAKE WITH OR IMMEDIATELY FOLLOWING A MEAL.      Cardiovascular:  Beta Blockers Failed - 11/01/2020  4:07 PM      Failed - Last BP in normal range    BP Readings from Last 1 Encounters:  04/24/20 (!) 155/88          Failed - Valid encounter within last 6 months    Recent Outpatient Visits           6 months ago Essential hypertension   [facility] Jerrol Banana., MD   9 months ago Essential hypertension   [facility] Jerrol Banana., MD   11 months ago Hypertension, unspecified type   [facility] Jerrol Banana., MD   1 year ago Annual physical exam   [facility] Jerrol Banana., MD       Future Appointments             In 1 week Jerrol Banana., MD [facility]             Passed - Last Heart Rate in normal range    Pulse Readings from Last 1 Encounters:  04/24/20 84

## 2020-11-01 NOTE — Telephone Encounter (Signed)
Pt called and says he has already made an OV for march 3rd to qualify for metoprolol Rx refill.

## 2020-11-13 NOTE — Progress Notes (Signed)
I,[name],acting as a scribe for Gabriel Durie, MD.,have documented all relevant documentation on the behalf of Gabriel Durie, MD,as directed by  Gabriel Durie, MD while in the presence of Gabriel Durie, MD.   Established patient visit   Patient: Gabriel Harvey   DOB: 12-24-1960   60 y.o. Male  MRN: [id number] Visit Date: 11/14/2020  Today's healthcare provider: [name], MD   Chief Complaint  Patient presents with  . Follow-up  . Hypertension   Subjective    HPI  Patient feels well but has not taken his blood pressure at home. He has a main complaint today of ED which he thinks is worse since starting amlodipine. Hypertension, follow-up  BP Readings from Last 3 Encounters:  11/14/20 (!) 154/82  04/24/20 (!) 155/88  01/23/20 (!) 155/82   Wt Readings from Last 3 Encounters:  11/14/20 177 lb (80.3 kg)  04/24/20 175 lb (79.4 kg)  01/23/20 174 lb (78.9 kg)     He was last seen for hypertension 7 months ago.  BP at that visit was 155/88. Management since that visit includes; Work on diet and exercise and check blood pressure readings at home and follow-up with 3 months. He reports good compliance with treatment. He is not having side effects. none He is exercising. He is adherent to low salt diet.   Outside blood pressures are up and down.  He does not smoke.  Use of agents associated with hypertension: none.   --------------------------------------------------------------------       Medications: Outpatient Medications Prior to Visit  Medication Sig  . amLODipine (NORVASC) 10 MG tablet TAKE 1 TABLET BY MOUTH DAILY  . aspirin EC 81 MG tablet Take 81 mg by mouth daily. As needed  . diltiazem (CARDIZEM) 30 MG tablet Take 30 mg by mouth 1 day or 1 dose.  . fexofenadine (ALLEGRA) 180 MG tablet Take 180 mg by mouth daily.  . metoprolol succinate (TOPROL-XL) 100 MG 24 hr tablet Take 1 tablet (100 mg total) by mouth 2 (two) times  daily. Take with or immediately following a meal.  . montelukast (SINGULAIR) 10 MG tablet Take 1 tablet (10 mg total) by mouth at bedtime.   No facility-administered medications prior to visit.    Review of Systems  Constitutional: Negative for appetite change, chills and fever.  Respiratory: Negative for chest tightness, shortness of breath and wheezing.   Cardiovascular: Negative for chest pain and palpitations.  Gastrointestinal: Negative for abdominal pain, nausea and vomiting.        Objective    BP (!) 154/82 (BP Location: Right Arm, Patient Position: Sitting, Cuff Size: Large)   Pulse 95   Temp 98.7 F (37.1 C) (Oral)   Resp 16   Ht 5\' 8"  (1.727 m)   Wt 177 lb (80.3 kg)   SpO2 95%   BMI 26.91 kg/m  BP Readings from Last 3 Encounters:  11/14/20 (!) 154/82  04/24/20 (!) 155/88  01/23/20 (!) 155/82   Wt Readings from Last 3 Encounters:  11/14/20 177 lb (80.3 kg)  04/24/20 175 lb (79.4 kg)  01/23/20 174 lb (78.9 kg)       Physical Exam Vitals reviewed.  HENT:     Head: Normocephalic and atraumatic.     Right Ear: External ear normal.     Left Ear: External ear normal.  Eyes:     General: No scleral icterus.    Conjunctiva/sclera: Conjunctivae normal.  Neck:  Vascular: No carotid bruit.  Cardiovascular:     Rate and Rhythm: Normal rate and regular rhythm.     Pulses: Normal pulses.     Heart sounds: Normal heart sounds.  Pulmonary:     Effort: Pulmonary effort is normal.     Breath sounds: Normal breath sounds.  Abdominal:     Palpations: Abdomen is soft.  Musculoskeletal:     Right lower leg: No edema.     Left lower leg: No edema.  Lymphadenopathy:     Cervical: No cervical adenopathy.  Skin:    General: Skin is warm and dry.  Neurological:     General: No focal deficit present.     Mental Status: He is alert and oriented to person, place, and time.  Psychiatric:        Mood and Affect: Mood normal.        Behavior: Behavior normal.         Thought Content: Thought content normal.        Judgment: Judgment normal.       No results found for any visits on 11/14/20.  Assessment & Plan     1. Essential hypertension Discontinue amlodipine 10 mg. Start chlorthalidone 25 mg daily. - chlorthalidone (HYGROTON) 25 MG tablet; Take 1 tablet (25 mg total) by mouth daily.  Dispense: 30 tablet; Refill: 2   Return in about 1 month (around 12/15/2020).      I, Gabriel Durie, MD, have reviewed all documentation for this visit. The documentation on 11/15/20 for the exam, diagnosis, procedures, and orders are all accurate and complete.    Taquita Demby Cranford Mon, MD  [facility] [phone] (phone) [phone] (fax)  [place]

## 2020-11-14 ENCOUNTER — Ambulatory Visit (INDEPENDENT_AMBULATORY_CARE_PROVIDER_SITE_OTHER): Payer: 59 | Admitting: Family Medicine

## 2020-11-14 ENCOUNTER — Other Ambulatory Visit: Payer: Self-pay

## 2020-11-14 ENCOUNTER — Encounter: Payer: Self-pay | Admitting: Family Medicine

## 2020-11-14 VITALS — BP 154/82 | HR 95 | Temp 98.7°F | Resp 16 | Ht 68.0 in | Wt 177.0 lb

## 2020-11-14 DIAGNOSIS — I10 Essential (primary) hypertension: Secondary | ICD-10-CM

## 2020-11-14 MED ORDER — CHLORTHALIDONE 25 MG PO TABS: 25.0000 mg | ORAL_TABLET | Freq: Every day | ORAL | 2 refills | Status: DC

## 2020-11-14 NOTE — Patient Instructions (Signed)
Discontinue amlodipine 10 mg. Start chlorthalidone 25 mg daily.

## 2020-11-18 ENCOUNTER — Other Ambulatory Visit: Payer: Self-pay | Admitting: Family Medicine

## 2020-11-18 DIAGNOSIS — I10 Essential (primary) hypertension: Secondary | ICD-10-CM

## 2020-11-18 NOTE — Telephone Encounter (Signed)
Requested medication (s) are due for refill today: Yes  Requested medication (s) are on the active medication list: Yes  Last refill:  10/17/19  Future visit scheduled: Yes  Notes to clinic:  Unable to refill per protocol, Rx expired.      Requested Prescriptions  Pending Prescriptions Disp Refills   metoprolol succinate (TOPROL-XL) 100 MG 24 hr tablet [Pharmacy Med Name: METOPROLOL SUCCINATE ER 100 MG TAB] 180 tablet 3    Sig: TAKE 1 TABLET BY MOUTH TWICE DAILY . TAKE WITH OR IMMEDIATELY FOLLOWING A MEAL.      Cardiovascular:  Beta Blockers Failed - 11/18/2020  4:07 PM      Failed - Last BP in normal range    BP Readings from Last 1 Encounters:  11/14/20 (!) 154/82          Passed - Last Heart Rate in normal range    Pulse Readings from Last 1 Encounters:  11/14/20 95          Passed - Valid encounter within last 6 months    Recent Outpatient Visits           4 days ago Essential hypertension   [facility] Jerrol Banana., MD   6 months ago Essential hypertension   [facility] Jerrol Banana., MD   10 months ago Essential hypertension   [facility] Jerrol Banana., MD   12 months ago Hypertension, unspecified type   [facility] Jerrol Banana., MD   1 year ago Annual physical exam   [facility] Jerrol Banana., MD       Future Appointments             In 1 month Jerrol Banana., MD [facility] [place]

## 2020-11-26 ENCOUNTER — Other Ambulatory Visit: Payer: Self-pay | Admitting: Family Medicine

## 2020-11-26 DIAGNOSIS — I10 Essential (primary) hypertension: Secondary | ICD-10-CM

## 2021-01-16 ENCOUNTER — Other Ambulatory Visit: Payer: Self-pay

## 2021-01-16 ENCOUNTER — Encounter: Payer: Self-pay | Admitting: Family Medicine

## 2021-01-16 ENCOUNTER — Ambulatory Visit (INDEPENDENT_AMBULATORY_CARE_PROVIDER_SITE_OTHER): Payer: 59 | Admitting: Family Medicine

## 2021-01-16 VITALS — BP 158/89 | HR 87 | Resp 16 | Ht 68.0 in | Wt 169.0 lb

## 2021-01-16 DIAGNOSIS — Z0000 Encounter for general adult medical examination without abnormal findings: Secondary | ICD-10-CM

## 2021-01-16 DIAGNOSIS — I10 Essential (primary) hypertension: Secondary | ICD-10-CM | POA: Diagnosis not present

## 2021-01-16 DIAGNOSIS — I4891 Unspecified atrial fibrillation: Secondary | ICD-10-CM | POA: Diagnosis not present

## 2021-01-16 DIAGNOSIS — Z125 Encounter for screening for malignant neoplasm of prostate: Secondary | ICD-10-CM

## 2021-01-16 DIAGNOSIS — Z1211 Encounter for screening for malignant neoplasm of colon: Secondary | ICD-10-CM

## 2021-01-16 NOTE — Progress Notes (Signed)
I,Gabriel Harvey,acting as a scribe for Gabriel Durie, MD.,have documented all relevant documentation on the behalf of Gabriel Durie, MD,as directed by  Gabriel Durie, MD while in the presence of Gabriel Durie, MD.   Established patient visit   Patient: Gabriel Harvey   DOB: 1961-05-05   60 y.o. Male  MRN: [id number] Visit Date: 01/16/2021  Today's healthcare provider: [name], MD   Chief Complaint  Patient presents with  . Follow-up  . Hypertension   Subjective    HPI  Is here for complete physical exam. All patient feels well.  Home blood pressures read 120-130/70-80. Patient states he has a "chronic Lyme disease".  since 2015. Hypertension, follow-up  BP Readings from Last 3 Encounters:  01/16/21 (!) 158/89  11/14/20 (!) 154/82  04/24/20 (!) 155/88   Wt Readings from Last 3 Encounters:  01/16/21 169 lb (76.7 kg)  11/14/20 177 lb (80.3 kg)  04/24/20 175 lb (79.4 kg)     He was last seen for hypertension 2 months ago.  BP at that visit was 154/82 . Management since that visit includes; Discontinued amlodipine 10 mg. Started chlorthalidone 25 mg daily. He reports good compliance with treatment. He is not having side effects. none He is exercising. He is adherent to low salt diet.   Outside blood pressures are 132/78.  He does not smoke.  Use of agents associated with hypertension: none.   --------------------------------------------------------------------       Medications: Outpatient Medications Prior to Visit  Medication Sig  . amLODipine (NORVASC) 10 MG tablet TAKE ONE TABLET BY MOUTH EVERY DAY  . aspirin EC 81 MG tablet Take 81 mg by mouth daily. As needed  . chlorthalidone (HYGROTON) 25 MG tablet Take 1 tablet (25 mg total) by mouth daily.  Marland Kitchen diltiazem (CARDIZEM) 30 MG tablet Take 30 mg by mouth 1 day or 1 dose.  . fexofenadine (ALLEGRA) 180 MG tablet Take 180 mg by mouth daily.  . metoprolol succinate (TOPROL-XL) 100  MG 24 hr tablet TAKE 1 TABLET BY MOUTH TWICE DAILY . TAKE WITH OR IMMEDIATELY FOLLOWING A MEAL.  . montelukast (SINGULAIR) 10 MG tablet Take 1 tablet (10 mg total) by mouth at bedtime.   No facility-administered medications prior to visit.    Review of Systems  Constitutional: Negative for appetite change, chills and fever.  Respiratory: Negative for chest tightness, shortness of breath and wheezing.   Cardiovascular: Negative for chest pain and palpitations.  Gastrointestinal: Negative for abdominal pain, nausea and vomiting.        Objective    BP (!) 158/89 (BP Location: Right Arm, Patient Position: Sitting, Cuff Size: Large)   Pulse 87   Resp 16   Ht 5\' 8"  (1.727 m)   Wt 169 lb (76.7 kg)   SpO2 97%   BMI 25.70 kg/m  BP Readings from Last 3 Encounters:  01/16/21 (!) 158/89  11/14/20 (!) 154/82  04/24/20 (!) 155/88   Wt Readings from Last 3 Encounters:  01/16/21 169 lb (76.7 kg)  11/14/20 177 lb (80.3 kg)  04/24/20 175 lb (79.4 kg)       Physical Exam Vitals reviewed.  HENT:     Head: Normocephalic and atraumatic.     Right Ear: External ear normal.     Left Ear: External ear normal.  Eyes:     General: No scleral icterus.    Conjunctiva/sclera: Conjunctivae normal.  Neck:     Vascular: No carotid  bruit.  Cardiovascular:     Rate and Rhythm: Normal rate and regular rhythm.     Pulses: Normal pulses.     Heart sounds: Normal heart sounds.  Pulmonary:     Effort: Pulmonary effort is normal.     Breath sounds: Normal breath sounds.  Abdominal:     Palpations: Abdomen is soft.     Comments: Small umbilical hernia  Genitourinary:    Penis: Normal.      Testes: Normal.  Musculoskeletal:     Right lower leg: No edema.     Left lower leg: No edema.  Lymphadenopathy:     Cervical: No cervical adenopathy.  Skin:    General: Skin is warm and dry.  Neurological:     General: No focal deficit present.     Mental Status: He is alert and oriented to person,  place, and time.  Psychiatric:        Mood and Affect: Mood normal.        Behavior: Behavior normal.        Thought Content: Thought content normal.        Judgment: Judgment normal.       No results found for any visits on 01/16/21.  Assessment & Plan     1. Annual physical exam  - Lipid panel - TSH - CBC w/Diff/Platelet - Comprehensive Metabolic Panel (CMET)  2. Essential hypertension  - Lipid panel - TSH - CBC w/Diff/Platelet - Comprehensive Metabolic Panel (CMET)  3. Hypertension, unspecified type  - Lipid panel - TSH - CBC w/Diff/Platelet - Comprehensive Metabolic Panel (CMET)  4. Atrial fibrillation, unspecified type ([facility])  - Lipid panel - TSH - CBC w/Diff/Platelet - Comprehensive Metabolic Panel (CMET)  5. Screening for prostate cancer  - PSA  6. Screening for colon cancer She had an adenomatous polyp in 2014.  Refer back to GI. - Ambulatory referral to Gastroenterology   Return in about 6 months (around 07/19/2021).      I, Gabriel Durie, MD, have reviewed all documentation for this visit. The documentation on 01/19/21 for the exam, diagnosis, procedures, and orders are all accurate and complete.    Gabriel Harvey Mon, MD  [facility] [phone] (phone) [phone] (fax)  Gabriel Harvey

## 2021-01-22 ENCOUNTER — Other Ambulatory Visit: Payer: Self-pay | Admitting: Family Medicine

## 2021-01-22 DIAGNOSIS — I10 Essential (primary) hypertension: Secondary | ICD-10-CM

## 2021-01-24 DIAGNOSIS — I10 Essential (primary) hypertension: Secondary | ICD-10-CM | POA: Diagnosis not present

## 2021-01-24 DIAGNOSIS — I4891 Unspecified atrial fibrillation: Secondary | ICD-10-CM | POA: Diagnosis not present

## 2021-01-24 DIAGNOSIS — Z125 Encounter for screening for malignant neoplasm of prostate: Secondary | ICD-10-CM | POA: Diagnosis not present

## 2021-01-24 DIAGNOSIS — Z0000 Encounter for general adult medical examination without abnormal findings: Secondary | ICD-10-CM | POA: Diagnosis not present

## 2021-01-25 LAB — TSH: TSH: 2.41 u[IU]/mL (ref 0.450–4.500)

## 2021-01-25 LAB — CBC WITH DIFFERENTIAL/PLATELET
Basophils Absolute: 0.1 10*3/uL (ref 0.0–0.2)
Basos: 1 %
EOS (ABSOLUTE): 0 10*3/uL (ref 0.0–0.4)
Eos: 1 %
Hematocrit: 43.1 % (ref 37.5–51.0)
Hemoglobin: 16.1 g/dL (ref 13.0–17.7)
Immature Grans (Abs): 0 10*3/uL (ref 0.0–0.1)
Immature Granulocytes: 0 %
Lymphocytes Absolute: 1.3 10*3/uL (ref 0.7–3.1)
Lymphs: 19 %
MCH: 33.1 pg — ABNORMAL HIGH (ref 26.6–33.0)
MCHC: 37.4 g/dL — ABNORMAL HIGH (ref 31.5–35.7)
MCV: 89 fL (ref 79–97)
Monocytes Absolute: 0.9 10*3/uL (ref 0.1–0.9)
Monocytes: 14 %
Neutrophils Absolute: 4.4 10*3/uL (ref 1.4–7.0)
Neutrophils: 65 %
Platelets: 143 10*3/uL — ABNORMAL LOW (ref 150–450)
RBC: 4.87 x10E6/uL (ref 4.14–5.80)
RDW: 12 % (ref 11.6–15.4)
WBC: 6.8 10*3/uL (ref 3.4–10.8)

## 2021-01-25 LAB — COMPREHENSIVE METABOLIC PANEL
ALT: 50 IU/L — ABNORMAL HIGH (ref 0–44)
AST: 68 IU/L — ABNORMAL HIGH (ref 0–40)
Albumin/Globulin Ratio: 1.6 (ref 1.2–2.2)
Albumin: 4.4 g/dL (ref 3.8–4.8)
Alkaline Phosphatase: 104 IU/L (ref 44–121)
BUN/Creatinine Ratio: 5 — ABNORMAL LOW (ref 10–24)
BUN: 4 mg/dL — ABNORMAL LOW (ref 8–27)
Bilirubin Total: 2.4 mg/dL — ABNORMAL HIGH (ref 0.0–1.2)
CO2: 28 mmol/L (ref 20–29)
Calcium: 9.4 mg/dL (ref 8.6–10.2)
Chloride: 81 mmol/L — ABNORMAL LOW (ref 96–106)
Creatinine, Ser: 0.79 mg/dL (ref 0.76–1.27)
Globulin, Total: 2.7 g/dL (ref 1.5–4.5)
Glucose: 96 mg/dL (ref 65–99)
Potassium: 2.7 mmol/L — ABNORMAL LOW (ref 3.5–5.2)
Sodium: 128 mmol/L — ABNORMAL LOW (ref 134–144)
Total Protein: 7.1 g/dL (ref 6.0–8.5)
eGFR: 100 mL/min/{1.73_m2} (ref >59)

## 2021-01-25 LAB — PSA: Prostate Specific Ag, Serum: 1.1 ng/mL (ref 0.0–4.0)

## 2021-01-25 LAB — LIPID PANEL
Chol/HDL Ratio: 1.9 ratio (ref 0.0–5.0)
Cholesterol, Total: 160 mg/dL (ref 100–199)
HDL: 84 mg/dL (ref >39)
LDL Chol Calc (NIH): 63 mg/dL (ref 0–99)
Triglycerides: 66 mg/dL (ref 0–149)
VLDL Cholesterol Cal: 13 mg/dL (ref 5–40)

## 2021-01-28 ENCOUNTER — Other Ambulatory Visit: Payer: Self-pay | Admitting: Family Medicine

## 2021-01-28 DIAGNOSIS — J309 Allergic rhinitis, unspecified: Secondary | ICD-10-CM

## 2021-01-30 ENCOUNTER — Telehealth: Payer: Self-pay

## 2021-01-30 DIAGNOSIS — E876 Hypokalemia: Secondary | ICD-10-CM

## 2021-01-30 MED ORDER — POTASSIUM CHLORIDE CRYS ER 10 MEQ PO TBCR: 10.0000 meq | EXTENDED_RELEASE_TABLET | Freq: Four times a day (QID) | ORAL | 5 refills | Status: DC

## 2021-01-30 NOTE — Telephone Encounter (Signed)
Advised patient of results. Medication was sent into the pharmacy.  

## 2021-01-30 NOTE — Telephone Encounter (Signed)
-----   Message from Jerrol Banana., MD sent at 01/29/2021  2:22 PM EDT ----- Labs stable but potassium too low.  Also mild changes in liver function test.  At the start time start KCl 10 mEq, 4 daily, #120, 5 refills.  Follow-up in 2 months to repeat lab

## 2021-02-26 ENCOUNTER — Encounter: Payer: Self-pay | Admitting: *Deleted

## 2021-02-28 ENCOUNTER — Telehealth (INDEPENDENT_AMBULATORY_CARE_PROVIDER_SITE_OTHER): Payer: 59 | Admitting: Gastroenterology

## 2021-02-28 DIAGNOSIS — Z1211 Encounter for screening for malignant neoplasm of colon: Secondary | ICD-10-CM

## 2021-02-28 MED ORDER — CLENPIQ 10-3.5-12 MG-GM -GM/160ML PO SOLN
1.0000 | Freq: Once | ORAL | 0 refills | Status: AC
Start: 2021-02-28 — End: 2021-02-28

## 2021-02-28 NOTE — Progress Notes (Signed)
Gastroenterology Pre-Procedure Review  Request Date: 04/01/2021 Requesting Physician: Dr. Bonna Gains   PATIENT REVIEW QUESTIONS: The patient responded to the following health history questions as indicated:    1. Are you having any GI issues? no 2. Do you have a personal history of Polyps? no 3. Do you have a family history of Colon Cancer or Polyps? no 4. Diabetes Mellitus? no 5. Joint replacements in the past 12 months?no 6. Major health problems in the past 3 months?no 7. Any artificial heart valves, MVP, or defibrillator?no    MEDICATIONS & ALLERGIES:    Patient reports the following regarding taking any anticoagulation/antiplatelet therapy:   Plavix, Coumadin, Eliquis, Xarelto, Lovenox, Pradaxa, Brilinta, or Effient? no Aspirin? yes (Aspirin 81 mg )  Patient confirms/reports the following medications:  Current Outpatient Medications  Medication Sig Dispense Refill   amLODipine (NORVASC) 10 MG tablet TAKE ONE TABLET BY MOUTH EVERY DAY 90 tablet 0   aspirin EC 81 MG tablet Take 81 mg by mouth daily. As needed     chlorthalidone (HYGROTON) 25 MG tablet TAKE 1 TABLET BY MOUTH DAILY 30 tablet 11   diltiazem (CARDIZEM) 30 MG tablet Take 30 mg by mouth 1 day or 1 dose.     fexofenadine (ALLEGRA) 180 MG tablet Take 180 mg by mouth daily.     metoprolol succinate (TOPROL-XL) 100 MG 24 hr tablet TAKE 1 TABLET BY MOUTH TWICE DAILY . TAKE WITH OR IMMEDIATELY FOLLOWING A MEAL. 180 tablet 3   montelukast (SINGULAIR) 10 MG tablet TAKE 1 TABLET BY MOUTH AT BEDTIME 90 tablet 3   potassium chloride (KLOR-CON) 10 MEQ tablet Take 1 tablet (10 mEq total) by mouth 4 (four) times daily. 120 tablet 5   No current facility-administered medications for this visit.    Patient confirms/reports the following allergies:  Allergies  Allergen Reactions   Augmentin [Amoxicillin-Pot Clavulanate] Other (See Comments)    headache   Iodine     Child allergy    No orders of the defined types were placed in  this encounter.   AUTHORIZATION INFORMATION Primary Insurance: 1D#: Group #:  Secondary Insurance: 1D#: Group #:  SCHEDULE INFORMATION: Date: 04/01/2021 Time: Location: [place]

## 2021-03-31 ENCOUNTER — Encounter: Payer: Self-pay | Admitting: Gastroenterology

## 2021-04-01 ENCOUNTER — Ambulatory Visit: Payer: 59 | Admitting: Certified Registered Nurse Anesthetist

## 2021-04-01 ENCOUNTER — Other Ambulatory Visit: Payer: Self-pay

## 2021-04-01 ENCOUNTER — Ambulatory Visit
Admission: RE | Admit: 2021-04-01 | Discharge: 2021-04-01 | Disposition: A | Discharge status: Home / Self Care | Payer: 59 | Attending: Gastroenterology | Admitting: Gastroenterology

## 2021-04-01 ENCOUNTER — Encounter
Admission: RE | Disposition: A | Discharge status: Home / Self Care | Payer: Self-pay | Source: Home / Self Care | Attending: Gastroenterology

## 2021-04-01 ENCOUNTER — Encounter: Payer: Self-pay | Admitting: Gastroenterology

## 2021-04-01 DIAGNOSIS — K5730 Diverticulosis of large intestine without perforation or abscess without bleeding: Secondary | ICD-10-CM | POA: Insufficient documentation

## 2021-04-01 DIAGNOSIS — Z79899 Other long term (current) drug therapy: Secondary | ICD-10-CM | POA: Insufficient documentation

## 2021-04-01 DIAGNOSIS — I4891 Unspecified atrial fibrillation: Secondary | ICD-10-CM | POA: Insufficient documentation

## 2021-04-01 DIAGNOSIS — Z7982 Long term (current) use of aspirin: Secondary | ICD-10-CM | POA: Diagnosis not present

## 2021-04-01 DIAGNOSIS — D122 Benign neoplasm of ascending colon: Secondary | ICD-10-CM | POA: Diagnosis not present

## 2021-04-01 DIAGNOSIS — Z806 Family history of leukemia: Secondary | ICD-10-CM | POA: Insufficient documentation

## 2021-04-01 DIAGNOSIS — K635 Polyp of colon: Secondary | ICD-10-CM | POA: Diagnosis not present

## 2021-04-01 DIAGNOSIS — Z825 Family history of asthma and other chronic lower respiratory diseases: Secondary | ICD-10-CM | POA: Insufficient documentation

## 2021-04-01 DIAGNOSIS — D125 Benign neoplasm of sigmoid colon: Secondary | ICD-10-CM | POA: Insufficient documentation

## 2021-04-01 DIAGNOSIS — Z800 Family history of malignant neoplasm of digestive organs: Secondary | ICD-10-CM | POA: Diagnosis not present

## 2021-04-01 DIAGNOSIS — Z8249 Family history of ischemic heart disease and other diseases of the circulatory system: Secondary | ICD-10-CM | POA: Insufficient documentation

## 2021-04-01 DIAGNOSIS — Z1211 Encounter for screening for malignant neoplasm of colon: Secondary | ICD-10-CM | POA: Insufficient documentation

## 2021-04-01 DIAGNOSIS — D126 Benign neoplasm of colon, unspecified: Secondary | ICD-10-CM | POA: Diagnosis not present

## 2021-04-01 DIAGNOSIS — D124 Benign neoplasm of descending colon: Secondary | ICD-10-CM | POA: Diagnosis not present

## 2021-04-01 HISTORY — PX: COLONOSCOPY WITH PROPOFOL: SHX5780

## 2021-04-01 SURGERY — COLONOSCOPY WITH PROPOFOL
Anesthesia: General

## 2021-04-01 MED ORDER — LIDOCAINE HCL (CARDIAC) PF 100 MG/5ML IV SOSY
PREFILLED_SYRINGE | INTRAVENOUS | Status: DC | PRN
  Administered 2021-04-01: 50 mg via INTRAVENOUS

## 2021-04-01 MED ORDER — SODIUM CHLORIDE 0.9 % IV SOLN: INTRAVENOUS | Status: DC

## 2021-04-01 MED ORDER — PROPOFOL 10 MG/ML IV BOLUS
INTRAVENOUS | Status: DC | PRN
  Administered 2021-04-01: 20 mg via INTRAVENOUS
  Administered 2021-04-01: 60 mg via INTRAVENOUS
  Administered 2021-04-01: 20 mg via INTRAVENOUS

## 2021-04-01 MED ORDER — PROPOFOL 500 MG/50ML IV EMUL
INTRAVENOUS | Status: DC | PRN
  Administered 2021-04-01: 150 ug/kg/min via INTRAVENOUS

## 2021-04-01 NOTE — Anesthesia Postprocedure Evaluation (Signed)
Anesthesia Post Note  Patient: Gabriel Harvey  Procedure(s) Performed: COLONOSCOPY WITH PROPOFOL  Patient location during evaluation: Phase II Anesthesia Type: General Level of consciousness: awake and alert, awake and oriented Pain management: pain level controlled Vital Signs Assessment: post-procedure vital signs reviewed and stable Respiratory status: spontaneous breathing, nonlabored ventilation and respiratory function stable Cardiovascular status: blood pressure returned to baseline and stable Postop Assessment: no apparent nausea or vomiting Anesthetic complications: no   No notable events documented.   Last Vitals:  Vitals:   04/01/21 0816 04/01/21 0827  BP: 114/86 (!) 155/88  Pulse:    Resp: 20   Temp:  (!) 36.3 C  SpO2: 98%     Last Pain:  Vitals:   04/01/21 0846  TempSrc:   PainSc: 0-No pain                 Phill Mutter

## 2021-04-01 NOTE — H&P (Signed)
Vonda Antigua, MD [address], [place], [place], [place], [address] [address], [place], [place], [place], [address] Phone: [phone]  Fax: [phone]  Primary Care Physician:  Jerrol Banana., MD   Pre-Procedure History & Physical: HPI:  Gabriel Harvey is a 60 y.o. male is here for a colonoscopy.   Past Medical History:  Diagnosis Date   A-fib [facility])    Allergy    Hypertension     Past Surgical History:  Procedure Laterality Date   CERVICAL FUSION  2015   C2 & C3   VASECTOMY      Prior to Admission medications   Medication Sig Start Date End Date Taking? Authorizing Provider  amLODipine (NORVASC) 10 MG tablet TAKE ONE TABLET BY MOUTH EVERY DAY 11/26/20  Yes Jerrol Banana., MD  aspirin EC 81 MG tablet Take 81 mg by mouth daily. As needed   Yes [provider]  chlorthalidone (HYGROTON) 25 MG tablet TAKE 1 TABLET BY MOUTH DAILY 01/23/21  Yes Jerrol Banana., MD  diltiazem (CARDIZEM) 30 MG tablet Take 30 mg by mouth 1 day or 1 dose.   Yes [provider]  fexofenadine (ALLEGRA) 180 MG tablet Take 180 mg by mouth daily.   Yes [provider]  metoprolol succinate (TOPROL-XL) 100 MG 24 hr tablet TAKE 1 TABLET BY MOUTH TWICE DAILY . TAKE WITH OR IMMEDIATELY FOLLOWING A MEAL. 11/20/20  Yes Jerrol Banana., MD  montelukast (SINGULAIR) 10 MG tablet TAKE 1 TABLET BY MOUTH AT BEDTIME 01/28/21  Yes Jerrol Banana., MD  potassium chloride (KLOR-CON) 10 MEQ tablet Take 1 tablet (10 mEq total) by mouth 4 (four) times daily. 01/30/21  Yes Jerrol Banana., MD    Allergies as of 02/28/2021 - Review Complete 02/28/2021  Allergen Reaction Noted   Augmentin [amoxicillin-pot clavulanate] Other (See Comments) 05/10/2019   Iodine  05/10/2019    Family History  Problem Relation Age of Onset   Pancreatic cancer Mother    COPD Mother    Liver cancer Father    Heart Problems Maternal Uncle    Heart disease Maternal  Grandmother    Leukemia Maternal Grandfather    Heart Problems Paternal Grandmother     Social History   Socioeconomic History   Marital status: Divorced    Spouse name: Not on file   Number of children: Not on file   Years of education: Not on file   Highest education level: Not on file  Occupational History   Not on file  Tobacco Use   Smoking status: Never   Smokeless tobacco: Current    Types: Chew  Substance and Sexual Activity   Alcohol use: Yes    Alcohol/week: 2.0 standard drinks    Types: 2 Cans of beer per week    Comment: occasional    Drug use: Never   Sexual activity: Not Currently  Other Topics Concern   Not on file  Social History Narrative   Not on file   Social Determinants of Health   Financial Resource Strain: Not on file  Food Insecurity: Not on file  Transportation Needs: Not on file  Physical Activity: Not on file  Stress: Not on file  Social Connections: Not on file  Intimate Partner Violence: Not on file    Review of Systems: See HPI, otherwise negative ROS  Physical Exam: Constitutional: General:   Alert,  Well-developed, well-nourished, pleasant and cooperative in NAD BP Marland Kitchen)  161/101   Pulse (!) 128   Temp 98.2 F (36.8 C) (Temporal)   Resp 18   Ht 5\' 8"  (1.727 m)   Wt 78 kg   SpO2 97%   BMI 26.15 kg/m   Head: Normocephalic, atraumatic.   Eyes:  Sclera clear, no icterus.   Conjunctiva pink.   Mouth:  No deformity or lesions, oropharynx pink & moist.  Neck:  Supple, trachea midline  Respiratory: Normal respiratory effort  Gastrointestinal:  Soft, non-tender and non-distended without masses, hepatosplenomegaly or hernias noted.  No guarding or rebound tenderness.     Cardiac: No clubbing or edema.  No cyanosis. Normal posterior tibial pedal pulses noted.  Lymphatic:  No significant cervical adenopathy.  Psych:  Alert and cooperative. Normal mood and affect.  Musculoskeletal:   Symmetrical without gross deformities.  5/5 Lower extremity strength bilaterally.  Skin: Warm. Intact without significant lesions or rashes. No jaundice.  Neurologic:  Face symmetrical, tongue midline, Normal sensation to touch;  grossly normal neurologically.  Psych:  Alert and oriented x3, Alert and cooperative. Normal mood and affect.  Impression/Plan: Gabriel Harvey is here for a colonoscopy to be performed for history of polyps, tubular adenoma polyp in 2014. Procedure report not available, pathology report available in labs.   Risks, benefits, limitations, and alternatives regarding  colonoscopy have been reviewed with the patient.  Questions have been answered.  All parties agreeable.   Virgel Manifold, MD  04/01/2021, 7:37 AM

## 2021-04-01 NOTE — Anesthesia Preprocedure Evaluation (Signed)
Anesthesia Evaluation  Patient identified by MRN, date of birth, ID band Patient awake    Reviewed: Allergy & Precautions, NPO status , Patient's Chart, lab work & pertinent test results, reviewed documented beta blocker date and time   Airway Mallampati: II  TM Distance: >3 FB Neck ROM: Full    Dental  (+) Poor Dentition, Missing   Pulmonary neg pulmonary ROS,    Pulmonary exam normal        Cardiovascular hypertension (Currently tachycardic but has not taken beta blocker in 2 days), Pt. on medications and Pt. on home beta blockers Normal cardiovascular exam+ dysrhythmias (2011 Secondary to dehydration, resolved) Atrial Fibrillation      Neuro/Psych negative neurological ROS  negative psych ROS   GI/Hepatic negative GI ROS, Neg liver ROS,   Endo/Other  negative endocrine ROS  Renal/GU negative Renal ROS  negative genitourinary   Musculoskeletal negative musculoskeletal ROS (+)   Abdominal   Peds negative pediatric ROS (+)  Hematology negative hematology ROS (+)   Anesthesia Other Findings A-fib    Allergy    Hypertension       Reproductive/Obstetrics negative OB ROS                             Anesthesia Physical Anesthesia Plan  ASA: 3  Anesthesia Plan: General   Post-op Pain Management:    Induction: Intravenous  PONV Risk Score and Plan: 2 and TIVA and Propofol infusion  Airway Management Planned: Natural Airway and Nasal Cannula  Additional Equipment:   Intra-op Plan:   Post-operative Plan:   Informed Consent: I have reviewed the patients History and Physical, chart, labs and discussed the procedure including the risks, benefits and alternatives for the proposed anesthesia with the patient or authorized representative who has indicated his/her understanding and acceptance.       Plan Discussed with: CRNA, Anesthesiologist and Surgeon  Anesthesia Plan Comments:          Anesthesia Quick Evaluation

## 2021-04-01 NOTE — Transfer of Care (Signed)
Immediate Anesthesia Transfer of Care Note  Patient: Gabriel Harvey  Procedure(s) Performed: COLONOSCOPY WITH PROPOFOL  Patient Location: PACU  Anesthesia Type:General  Level of Consciousness: drowsy  Airway & Oxygen Therapy: Patient Spontanous Breathing  Post-op Assessment: Report given to RN and Post -op Vital signs reviewed and stable  Post vital signs: Reviewed and stable  Last Vitals:  Vitals Value Taken Time  BP 114/86 04/01/21 0816  Temp    Pulse 89 04/01/21 0816  Resp 20 04/01/21 0816  SpO2 98 % 04/01/21 0816  Vitals shown include unvalidated device data.  Last Pain:  Vitals:   04/01/21 0652  TempSrc: Temporal         Complications: No notable events documented.

## 2021-04-01 NOTE — Op Note (Signed)
[facility] Patient Name: Gabriel Harvey Procedure Date: 04/01/2021 7:09 AM MRN: [id number] Account #: [id number] Date of Birth: April 07, 1961 Admit Type: Outpatient Age: 60 Room: [facility] ENDO ROOM 3 Gender: Male Note Status: Finalized Procedure:             Colonoscopy Indications:           High risk colon cancer surveillance: Personal history                         of colonic polyps Providers:             Lyan Moyano B. Bonna Gains MD, MD Referring MD:          Janine Ores. Rosanna Randy, MD (Referring MD) Medicines:             Monitored Anesthesia Care Complications:         No immediate complications. Procedure:             Pre-Anesthesia Assessment:                        - ASA Grade Assessment: II - A patient with mild                         systemic disease.                        - Prior to the procedure, a History and Physical was                         performed, and patient medications, allergies and                         sensitivities were reviewed. The patient's tolerance                         of previous anesthesia was reviewed.                        - The risks and benefits of the procedure and the                         sedation options and risks were discussed with the                         patient. All questions were answered and informed                         consent was obtained.                        - Patient identification and proposed procedure were                         verified prior to the procedure by the physician, the                         nurse, the anesthesiologist, the anesthetist and the                         technician. The procedure  was verified in the                         procedure room.                        After obtaining informed consent, the colonoscope was                         passed under direct vision. Throughout the procedure,                         the patient's blood pressure, pulse, and  oxygen                         saturations were monitored continuously. The                         Colonoscope was introduced through the anus and                         advanced to the the cecum, identified by appendiceal                         orifice and ileocecal valve. The colonoscopy was                         performed with ease. The patient tolerated the                         procedure well. The quality of the bowel preparation                         was good. Findings:      The perianal and digital rectal examinations were normal.      Three flat and sessile polyps were found in the ascending colon. The       polyps were 4 to 6 mm in size. These polyps were removed with a cold       snare. Resection and retrieval were complete.      A 8 mm polyp was found in the descending colon. The polyp was sessile.       The polyp was removed with a cold snare. Resection and retrieval were       complete.      A 4 mm polyp was found in the sigmoid colon. The polyp was sessile. The       polyp was removed with a cold snare. Resection and retrieval were       complete.      A few diverticula were found in the sigmoid colon.      The exam was otherwise without abnormality.      The rectum, sigmoid colon, descending colon, transverse colon, ascending       colon and cecum appeared normal.      Anal papilla(e) were hypertrophied.      No additional abnormalities were found on retroflexion. Impression:            - Three 4 to 6 mm polyps in the ascending colon,  removed with a cold snare. Resected and retrieved.                        - One 8 mm polyp in the descending colon, removed with                         a cold snare. Resected and retrieved.                        - One 4 mm polyp in the sigmoid colon, removed with a                         cold snare. Resected and retrieved.                        - Diverticulosis in the sigmoid colon.                         - The examination was otherwise normal.                        - The rectum, sigmoid colon, descending colon,                         transverse colon, ascending colon and cecum are normal.                        - Anal papilla(e) were hypertrophied. Recommendation:        - Discharge patient to home (with escort).                        - High fiber diet.                        - Advance diet as tolerated.                        - Continue present medications.                        - Await pathology results.                        - Repeat colonoscopy date to be determined after                         pending pathology results are reviewed.                        - The findings and recommendations were discussed with                         the patient.                        - The findings and recommendations were discussed with                         the patient's family.                        -  Return to primary care physician as previously                         scheduled. Procedure Code(s):     --- Professional ---                        [phone], Colonoscopy, flexible; with removal of                         tumor(s), polyp(s), or other lesion(s) by snare                         technique Diagnosis Code(s):     --- Professional ---                        Z86.010, Personal history of colonic polyps                        K63.5, Polyp of colon CPT copyright 2019 American Medical Association. All rights reserved. The codes documented in this report are preliminary and upon coder review may  be revised to meet current compliance requirements.  Vonda Antigua, MD Margretta Sidle B. Bonna Gains MD, MD 04/01/2021 8:22:28 AM This report has been signed electronically. Number of Addenda: 0 Note Initiated On: 04/01/2021 7:09 AM Scope Withdrawal Time: 0 hours 25 minutes 10 seconds  Total Procedure Duration: 0 hours 31 minutes 18 seconds  Estimated Blood Loss:  Estimated blood loss: none.       [facility]

## 2021-04-02 ENCOUNTER — Encounter: Payer: Self-pay | Admitting: Gastroenterology

## 2021-04-02 LAB — SURGICAL PATHOLOGY

## 2021-07-24 ENCOUNTER — Ambulatory Visit (INDEPENDENT_AMBULATORY_CARE_PROVIDER_SITE_OTHER): Payer: 59 | Admitting: Family Medicine

## 2021-07-24 ENCOUNTER — Other Ambulatory Visit: Payer: Self-pay

## 2021-07-24 ENCOUNTER — Encounter: Payer: Self-pay | Admitting: Family Medicine

## 2021-07-24 VITALS — BP 183/88 | HR 95 | Temp 98.3°F | Resp 16 | Ht 68.0 in | Wt 163.0 lb

## 2021-07-24 DIAGNOSIS — I10 Essential (primary) hypertension: Secondary | ICD-10-CM

## 2021-07-24 DIAGNOSIS — E876 Hypokalemia: Secondary | ICD-10-CM | POA: Diagnosis not present

## 2021-07-24 DIAGNOSIS — J309 Allergic rhinitis, unspecified: Secondary | ICD-10-CM | POA: Diagnosis not present

## 2021-07-24 DIAGNOSIS — R2681 Unsteadiness on feet: Secondary | ICD-10-CM

## 2021-07-24 NOTE — Progress Notes (Signed)
I,[name],acting as a scribe for Gabriel Durie, MD.,have documented all relevant documentation on the behalf of Gabriel Durie, MD,as directed by  Gabriel Durie, MD while in the presence of Gabriel Durie, MD.   Established patient visit   Patient: Gabriel Harvey   DOB: 1960/10/06   60 y.o. Male  MRN: [id number] Visit Date: 07/24/2021  Today's healthcare provider: [name], MD   Chief Complaint  Patient presents with   Follow-up   Hypertension   Subjective    HPI  Patient states overall he is feeling pretty well but his blood pressure has been up in recent times. He states he is moving fine and has no complaints of any falls. I noticed a difference in his movement today and he may have some Parkinson's stigmata on exam. Hypertension, follow-up  BP Readings from Last 3 Encounters:  07/24/21 (!) 183/88  04/01/21 (!) 155/88  01/16/21 (!) 158/89   Wt Readings from Last 3 Encounters:  07/24/21 163 lb (73.9 kg)  04/01/21 172 lb (78 kg)  01/16/21 169 lb (76.7 kg)     He was last seen for hypertension 6 months ago.  BP at that visit was 158/89. Management since that visit includes; on amlodipine and chlorthalidone. He reports good compliance with treatment. He is not having side effects. none He is not exercising. He is adherent to low salt diet.   Outside blood pressures are 130/68.  He does not smoke.  Use of agents associated with hypertension: none.   --------------------------------------------------------------------------------------------------- Patient had labs checked showing-stable but potassium too low.  Also mild changes in liver function test.  At the start time start KCl 10 mEq, 4 daily, #120, 5 refills.  Follow-up in 2 months to repeat labs.     Medications: Outpatient Medications Prior to Visit  Medication Sig   amLODipine (NORVASC) 10 MG tablet TAKE ONE TABLET BY MOUTH EVERY DAY   aspirin EC 81 MG tablet Take 81  mg by mouth daily. As needed   chlorthalidone (HYGROTON) 25 MG tablet TAKE 1 TABLET BY MOUTH DAILY   diltiazem (CARDIZEM) 30 MG tablet Take 30 mg by mouth 1 day or 1 dose.   fexofenadine (ALLEGRA) 180 MG tablet Take 180 mg by mouth daily.   metoprolol succinate (TOPROL-XL) 100 MG 24 hr tablet TAKE 1 TABLET BY MOUTH TWICE DAILY . TAKE WITH OR IMMEDIATELY FOLLOWING A MEAL.   montelukast (SINGULAIR) 10 MG tablet TAKE 1 TABLET BY MOUTH AT BEDTIME   potassium chloride (KLOR-CON) 10 MEQ tablet Take 1 tablet (10 mEq total) by mouth 4 (four) times daily.   No facility-administered medications prior to visit.    Review of Systems  Constitutional:  Negative for appetite change, chills and fever.  Respiratory:  Negative for chest tightness, shortness of breath and wheezing.   Cardiovascular:  Negative for chest pain and palpitations.  Gastrointestinal:  Negative for abdominal pain, nausea and vomiting.      Objective    BP (!) 183/88 (BP Location: Left Arm, Patient Position: Sitting, Cuff Size: Normal)   Pulse 95   Temp 98.3 F (36.8 C) (Temporal)   Resp 16   Ht 5\' 8"  (1.727 m)   Wt 163 lb (73.9 kg)   SpO2 98%   BMI 24.78 kg/m  {Show previous vital signs (optional):23777}  Physical Exam Vitals reviewed.  HENT:     Head: Normocephalic and atraumatic.     Right Ear: External ear normal.  Left Ear: External ear normal.  Eyes:     General: No scleral icterus.    Conjunctiva/sclera: Conjunctivae normal.  Neck:     Vascular: No carotid bruit.  Cardiovascular:     Rate and Rhythm: Normal rate and regular rhythm.     Pulses: Normal pulses.     Heart sounds: Normal heart sounds.  Pulmonary:     Effort: Pulmonary effort is normal.     Breath sounds: Normal breath sounds.  Abdominal:     Palpations: Abdomen is soft.  Musculoskeletal:     Right lower leg: No edema.     Left lower leg: No edema.  Lymphadenopathy:     Cervical: No cervical adenopathy.  Skin:    General: Skin is  warm and dry.  Neurological:     General: No focal deficit present.     Mental Status: He is alert and oriented to person, place, and time.     Comments: He appears to have a little cogwheeling on exam today.  Minimal arm movement with his gait  Psychiatric:        Mood and Affect: Mood normal.        Behavior: Behavior normal.        Thought Content: Thought content normal.        Judgment: Judgment normal.      No results found for any visits on 07/24/21.  Assessment & Plan     1. Essential hypertension Elevated blood pressure in the office.  Consider hydralazine or clonidine - CBC w/Diff/Platelet - Comprehensive Metabolic Panel (CMET)  2. Hypokalemia Add potassium - CBC w/Diff/Platelet - Comprehensive Metabolic Panel (CMET)  3. Allergic rhinitis, unspecified seasonality, unspecified trigger   4. Unsteady gait Consider neurology referral per possible Parkinson's stigmata. Patient will let us know if he wants this   No follow-ups on file.      I, Gabriel Durie, MD, have reviewed all documentation for this visit. The documentation on 07/30/21 for the exam, diagnosis, procedures, and orders are all accurate and complete.    Cameshia Cressman Cranford Mon, MD  [facility] [phone] (phone) [phone] (fax)  [place]

## 2021-08-06 ENCOUNTER — Other Ambulatory Visit: Payer: Self-pay | Admitting: *Deleted

## 2021-08-06 DIAGNOSIS — M549 Dorsalgia, unspecified: Secondary | ICD-10-CM

## 2021-08-06 DIAGNOSIS — R2681 Unsteadiness on feet: Secondary | ICD-10-CM

## 2021-08-28 DIAGNOSIS — M542 Cervicalgia: Secondary | ICD-10-CM | POA: Diagnosis not present

## 2021-08-28 DIAGNOSIS — R2681 Unsteadiness on feet: Secondary | ICD-10-CM | POA: Diagnosis not present

## 2021-08-28 DIAGNOSIS — R251 Tremor, unspecified: Secondary | ICD-10-CM | POA: Diagnosis not present

## 2021-08-28 DIAGNOSIS — M2560 Stiffness of unspecified joint, not elsewhere classified: Secondary | ICD-10-CM | POA: Diagnosis not present

## 2021-09-19 DIAGNOSIS — M542 Cervicalgia: Secondary | ICD-10-CM | POA: Diagnosis not present

## 2021-09-19 DIAGNOSIS — Z981 Arthrodesis status: Secondary | ICD-10-CM | POA: Diagnosis not present

## 2021-10-20 ENCOUNTER — Other Ambulatory Visit: Payer: Self-pay | Admitting: Family Medicine

## 2021-10-20 DIAGNOSIS — E876 Hypokalemia: Secondary | ICD-10-CM

## 2021-10-23 DIAGNOSIS — R251 Tremor, unspecified: Secondary | ICD-10-CM | POA: Diagnosis not present

## 2021-10-23 DIAGNOSIS — M542 Cervicalgia: Secondary | ICD-10-CM | POA: Diagnosis not present

## 2021-10-23 DIAGNOSIS — M5412 Radiculopathy, cervical region: Secondary | ICD-10-CM | POA: Diagnosis not present

## 2021-11-04 DIAGNOSIS — M542 Cervicalgia: Secondary | ICD-10-CM | POA: Diagnosis not present

## 2021-11-04 DIAGNOSIS — M6281 Muscle weakness (generalized): Secondary | ICD-10-CM | POA: Diagnosis not present

## 2021-11-20 ENCOUNTER — Other Ambulatory Visit: Payer: Self-pay | Admitting: *Deleted

## 2021-11-20 DIAGNOSIS — M21622 Bunionette of left foot: Secondary | ICD-10-CM | POA: Diagnosis not present

## 2021-11-20 DIAGNOSIS — S92502A Displaced unspecified fracture of left lesser toe(s), initial encounter for closed fracture: Secondary | ICD-10-CM | POA: Diagnosis not present

## 2021-11-20 DIAGNOSIS — D2372 Other benign neoplasm of skin of left lower limb, including hip: Secondary | ICD-10-CM | POA: Diagnosis not present

## 2021-11-20 DIAGNOSIS — M79673 Pain in unspecified foot: Secondary | ICD-10-CM

## 2021-11-20 DIAGNOSIS — M79672 Pain in left foot: Secondary | ICD-10-CM | POA: Diagnosis not present

## 2021-11-27 ENCOUNTER — Other Ambulatory Visit: Payer: Self-pay | Admitting: Family Medicine

## 2021-11-28 NOTE — Telephone Encounter (Signed)
Requested Prescriptions  ?Pending Prescriptions Disp Refills  ?? metoprolol succinate (TOPROL-XL) 100 MG 24 hr tablet [Pharmacy Med Name: METOPROLOL SUCCINATE ER 100 MG TAB] 180 tablet 1  ?  Sig: TAKE 1 TABLET BY MOUTH TWICE DAILY . TAKE WITH OR IMMEDIATELY FOLLOWING A MEAL.  ?  ? Cardiovascular:  Beta Blockers Failed - 11/27/2021  4:21 PM  ?  ?  Failed - Last BP in normal range  ?  BP Readings from Last 1 Encounters:  ?07/24/21 (!) 183/88  ?   ?  ?  Passed - Last Heart Rate in normal range  ?  Pulse Readings from Last 1 Encounters:  ?07/24/21 95  ?   ?  ?  Passed - Valid encounter within last 6 months  ?  Recent Outpatient Visits   ?      ? 4 months ago Essential hypertension  ? [facility] Jerrol Banana., MD  ? 10 months ago Annual physical exam  ? [facility] Jerrol Banana., MD  ? 1 year ago Essential hypertension  ? [facility] Jerrol Banana., MD  ? 1 year ago Essential hypertension  ? [facility] Jerrol Banana., MD  ? 1 year ago Essential hypertension  ? [facility] Jerrol Banana., MD  ?  ?  ?Future Appointments   ?        ? In 3 weeks Jerrol Banana., MD [facility]  ?  ? ?  ?  ?  ? ?

## 2021-12-15 ENCOUNTER — Ambulatory Visit: Payer: 59 | Admitting: Family Medicine

## 2021-12-19 NOTE — Progress Notes (Signed)
?  ? ? ?Established patient visit ? ?I,[name],acting as a scribe for [name], MD.,have documented all relevant documentation on the behalf of [name], MD,as directed by  [name], MD while in the presence of [name], MD. ? ? ?Patient: Gabriel Harvey   DOB: 1961/06/25   61 y.o. Male  MRN: [id number] ?Visit Date: 12/22/2021 ? ?Today's healthcare provider: [name], MD  ? ?Chief Complaint  ?Patient presents with  ? Follow-up  ? Hypertension  ? ?Subjective  ?  ?HPI  ?Patient feels well since his last visit.  He has been helped by podiatry with foot pain and by neurology with gabapentin for cervical radiculopathy.  He states his blood pressures are better.  He feels well overall. ?Neurology also agrees patient probably has early Parkinson's disease but he is doing well enough where he is on no medications. ?Hypertension, follow-up ? ?BP Readings from Last 3 Encounters:  ?12/22/21 (!) 159/82  ?07/24/21 (!) 183/88  ?04/01/21 (!) 155/88  ? Wt Readings from Last 3 Encounters:  ?12/22/21 168 lb (76.2 kg)  ?07/24/21 163 lb (73.9 kg)  ?04/01/21 172 lb (78 kg)  ?  ? ?He was last seen for hypertension 4 months ago.  ?BP at that visit was 183/88.  ?Management since that visit includes; Elevated blood pressure in the office.  Consider hydralazine or clonidine. ? ?He reports good compliance with treatment. ?He is not having side effects. none ?He is following a Regular diet. ?He is exercising. ?He does not smoke. ? ?Use of agents associated with hypertension: none.  ? ?Outside blood pressures are 140/90-150/90. ? ?Pertinent labs ?Lab Results  ?Component Value Date  ? CHOL 160 01/24/2021  ? HDL 84 01/24/2021  ? [place] 63 01/24/2021  ? TRIG 66 01/24/2021  ? CHOLHDL 1.9 01/24/2021  ? Lab Results  ?Component Value Date  ? NA 128 (L) 01/24/2021  ? K 2.7 (L) 01/24/2021  ? CREATININE 0.79 01/24/2021  ? EGFR 100 01/24/2021  ? GLUCOSE 96 01/24/2021  ? TSH 2.410 01/24/2021  ?  ? ?The  10-year ASCVD risk score (Arnett DK, et al., 2019) is: 11.2% ? ?--------------------------------------------------------------------------------------------------- ?Follow up for Hypokalemia: ? ?The patient was last seen for this 4 months ago. ?Changes made at last visit includes; Added potassium. ? ?He reports good compliance with treatment. ?He feels that condition is Unchanged. ?He is not having side effects. none ? ?----------------------------------------------------------------------------------------- ? ? ?Medications: ?Outpatient Medications Prior to Visit  ?Medication Sig  ? amLODipine (NORVASC) 10 MG tablet TAKE ONE TABLET BY MOUTH EVERY DAY  ? aspirin EC 81 MG tablet Take 81 mg by mouth daily. As needed  ? chlorthalidone (HYGROTON) 25 MG tablet TAKE 1 TABLET BY MOUTH DAILY  ? diltiazem (CARDIZEM) 30 MG tablet Take 30 mg by mouth 1 day or 1 dose.  ? fexofenadine (ALLEGRA) 180 MG tablet Take 180 mg by mouth daily.  ? gabapentin (NEURONTIN) 100 MG capsule 200 mg 2 (two) times daily.  ? metoprolol succinate (TOPROL-XL) 100 MG 24 hr tablet TAKE 1 TABLET BY MOUTH TWICE DAILY . TAKE WITH OR IMMEDIATELY FOLLOWING A MEAL.  ? montelukast (SINGULAIR) 10 MG tablet TAKE 1 TABLET BY MOUTH AT BEDTIME  ? potassium chloride (KLOR-CON M) 10 MEQ tablet TAKE 1 TABLET BY MOUTH FOUR TIMES DAILY  ? ?No facility-administered medications prior to visit.  ? ? ?Review of Systems  ?Constitutional:  Negative for appetite change, chills and fever.  ?Respiratory:  Negative  for chest tightness, shortness of breath and wheezing.   ?Cardiovascular:  Negative for chest pain and palpitations.  ?Gastrointestinal:  Negative for abdominal pain, nausea and vomiting.  ? ?Last lipids ?Lab Results  ?Component Value Date  ? CHOL 160 01/24/2021  ? HDL 84 01/24/2021  ? [place] 63 01/24/2021  ? TRIG 66 01/24/2021  ? CHOLHDL 1.9 01/24/2021  ? ?  ?  Objective  ?  ?BP (!) 159/82 (BP Location: Right Arm, Patient Position: Sitting, Cuff Size: Large)    Pulse 80   Temp 98.7 ?F (37.1 ?C) (Temporal)   Resp 16   Wt 168 lb (76.2 kg)   SpO2 97%   BMI 25.54 kg/m?  ?BP Readings from Last 3 Encounters:  ?12/22/21 (!) 159/82  ?07/24/21 (!) 183/88  ?04/01/21 (!) 155/88  ? ?Wt Readings from Last 3 Encounters:  ?12/22/21 168 lb (76.2 kg)  ?07/24/21 163 lb (73.9 kg)  ?04/01/21 172 lb (78 kg)  ? ?  ? ?Physical Exam ?Vitals reviewed.  ?HENT:  ?   Head: Normocephalic and atraumatic.  ?   Right Ear: External ear normal.  ?   Left Ear: External ear normal.  ?Eyes:  ?   General: No scleral icterus. ?   Conjunctiva/sclera: Conjunctivae normal.  ?Neck:  ?   Vascular: No carotid bruit.  ?Cardiovascular:  ?   Rate and Rhythm: Normal rate and regular rhythm.  ?   Pulses: Normal pulses.  ?   Heart sounds: Normal heart sounds.  ?Pulmonary:  ?   Effort: Pulmonary effort is normal.  ?   Breath sounds: Normal breath sounds.  ?Abdominal:  ?   Palpations: Abdomen is soft.  ?Musculoskeletal:  ?   Right lower leg: No edema.  ?   Left lower leg: No edema.  ?Lymphadenopathy:  ?   Cervical: No cervical adenopathy.  ?Skin: ?   General: Skin is warm and dry.  ?Neurological:  ?   General: No focal deficit present.  ?   Mental Status: He is alert and oriented to person, place, and time.  ?Psychiatric:     ?   Mood and Affect: Mood normal.     ?   Behavior: Behavior normal.     ?   Thought Content: Thought content normal.     ?   Judgment: Judgment normal.  ?  ? ? ?No results found for any visits on 12/22/21. ? Assessment & Plan  ?  ? ?1. Essential hypertension ?Patient's blood pressure elevated in the office.  We will get home blood pressure readings and follow-up in 2 to 3 months.  I have recommended regular exercise which she has not been doing. ? ?2. Hypokalemia ?Follow-up blood work in the future. ? ?3. Unsteady gait ?Multifactorial.  Exercise should help. ? ?4. Cervical radiculopathy ?Helped by gabapentin.  He is now able to sleep through the night ? ?5. Parkinson's disease ([place]) ?Patient has  chosen no treatment although Sinemet may help him in the future. ? ? ?Return in about 11 weeks (around 03/09/2022).  ?   ? ?I, [name], MD, have reviewed all documentation for this visit. The documentation on 12/23/21 for the exam, diagnosis, procedures, and orders are all accurate and complete. ? ? ? ?Jazmina Muhlenkamp Cranford Mon, MD  ?[facility] ?[phone] (phone) ?[phone] (fax) ? ?[facility] ?

## 2021-12-22 ENCOUNTER — Ambulatory Visit (INDEPENDENT_AMBULATORY_CARE_PROVIDER_SITE_OTHER): Payer: 59 | Admitting: Family Medicine

## 2021-12-22 VITALS — BP 159/82 | HR 80 | Temp 98.7°F | Resp 16 | Wt 168.0 lb

## 2021-12-22 DIAGNOSIS — M5412 Radiculopathy, cervical region: Secondary | ICD-10-CM | POA: Diagnosis not present

## 2021-12-22 DIAGNOSIS — R2681 Unsteadiness on feet: Secondary | ICD-10-CM | POA: Diagnosis not present

## 2021-12-22 DIAGNOSIS — I10 Essential (primary) hypertension: Secondary | ICD-10-CM | POA: Diagnosis not present

## 2021-12-22 DIAGNOSIS — E876 Hypokalemia: Secondary | ICD-10-CM

## 2021-12-22 DIAGNOSIS — G20 Parkinson's disease: Secondary | ICD-10-CM

## 2021-12-22 NOTE — Patient Instructions (Addendum)
TRY EXERCISE BIKE 4-5 TIMES PER WEEK. ?

## 2021-12-24 ENCOUNTER — Other Ambulatory Visit: Payer: Self-pay | Admitting: Family Medicine

## 2021-12-24 DIAGNOSIS — J309 Allergic rhinitis, unspecified: Secondary | ICD-10-CM

## 2021-12-25 NOTE — Telephone Encounter (Signed)
Requested Prescriptions  ?Pending Prescriptions Disp Refills  ?? montelukast (SINGULAIR) 10 MG tablet [Pharmacy Med Name: MONTELUKAST SODIUM 10 MG TAB] 90 tablet 3  ?  Sig: TAKE 1 TABLET BY MOUTH AT BEDTIME  ?  ? Pulmonology:  Leukotriene Inhibitors Passed - 12/24/2021  4:16 PM  ?  ?  Passed - Valid encounter within last 12 months  ?  Recent Outpatient Visits   ?      ? 3 days ago Essential hypertension  ? [facility] Jerrol Banana., MD  ? 5 months ago Essential hypertension  ? [facility] Jerrol Banana., MD  ? 11 months ago Annual physical exam  ? [facility] Jerrol Banana., MD  ? 1 year ago Essential hypertension  ? [facility] Jerrol Banana., MD  ? 1 year ago Essential hypertension  ? [facility] Jerrol Banana., MD  ?  ?  ?Future Appointments   ?        ? In 2 months Jerrol Banana., MD [facility]  ?  ? ?  ?  ?  ? ?

## 2022-01-12 ENCOUNTER — Other Ambulatory Visit: Payer: Self-pay | Admitting: Family Medicine

## 2022-01-12 DIAGNOSIS — I10 Essential (primary) hypertension: Secondary | ICD-10-CM

## 2022-01-13 NOTE — Telephone Encounter (Signed)
Requested medication (s) are due for refill today: Due 01/23/22 ? ?Requested medication (s) are on the active medication list: yes   ? ?Last refill: 01/23/22  #30  11 refills ? ?Future visit scheduled yes 03/09/22 ? ?Notes to clinic:Failed due to labs, please review. Thank you. ? ?Requested Prescriptions  ?Pending Prescriptions Disp Refills  ? chlorthalidone (HYGROTON) 25 MG tablet [Pharmacy Med Name: CHLORTHALIDONE 25 MG TAB] 30 tablet 11  ?  Sig: TAKE 1 TABLET BY MOUTH DAILY  ?  ? Cardiovascular: Diuretics - Thiazide Failed - 01/12/2022  9:33 AM  ?  ?  Failed - Cr in normal range and within 180 days  ?  Creatinine, Ser  ?Date Value Ref Range Status  ?01/24/2021 0.79 0.76 - 1.27 mg/dL Final  ?  ?  ?  ?  Failed - K in normal range and within 180 days  ?  Potassium  ?Date Value Ref Range Status  ?01/24/2021 2.7 (L) 3.5 - 5.2 mmol/L Final  ?  ?  ?  ?  Failed - Na in normal range and within 180 days  ?  Sodium  ?Date Value Ref Range Status  ?01/24/2021 128 (L) 134 - 144 mmol/L Final  ?  ?  ?  ?  Failed - Last BP in normal range  ?  BP Readings from Last 1 Encounters:  ?12/22/21 (!) 159/82  ?  ?  ?  ?  Passed - Valid encounter within last 6 months  ?  Recent Outpatient Visits   ? ?      ? 3 weeks ago Essential hypertension  ? [facility] Jerrol Banana., MD  ? 5 months ago Essential hypertension  ? [facility] Jerrol Banana., MD  ? 12 months ago Annual physical exam  ? [facility] Jerrol Banana., MD  ? 1 year ago Essential hypertension  ? [facility] Jerrol Banana., MD  ? 1 year ago Essential hypertension  ? [facility] Jerrol Banana., MD  ? ?  ?  ?Future Appointments   ? ?        ? In 1 month Jerrol Banana., MD [facility]  ? ?  ? ? ?  ?  ?  ? ? ? ? ?

## 2022-01-20 DIAGNOSIS — M542 Cervicalgia: Secondary | ICD-10-CM | POA: Diagnosis not present

## 2022-01-20 DIAGNOSIS — M5412 Radiculopathy, cervical region: Secondary | ICD-10-CM | POA: Diagnosis not present

## 2022-01-20 DIAGNOSIS — R2681 Unsteadiness on feet: Secondary | ICD-10-CM | POA: Diagnosis not present

## 2022-01-20 DIAGNOSIS — R251 Tremor, unspecified: Secondary | ICD-10-CM | POA: Diagnosis not present

## 2022-02-24 ENCOUNTER — Ambulatory Visit (INDEPENDENT_AMBULATORY_CARE_PROVIDER_SITE_OTHER): Payer: 59 | Admitting: Family Medicine

## 2022-02-24 ENCOUNTER — Encounter: Payer: Self-pay | Admitting: Family Medicine

## 2022-02-24 VITALS — BP 161/93 | HR 114 | Temp 98.6°F | Resp 16 | Wt 162.0 lb

## 2022-02-24 DIAGNOSIS — H6502 Acute serous otitis media, left ear: Secondary | ICD-10-CM | POA: Diagnosis not present

## 2022-02-24 DIAGNOSIS — J0120 Acute ethmoidal sinusitis, unspecified: Secondary | ICD-10-CM | POA: Diagnosis not present

## 2022-02-24 MED ORDER — SULFAMETHOXAZOLE-TRIMETHOPRIM 800-160 MG PO TABS: 1.0000 | ORAL_TABLET | Freq: Two times a day (BID) | ORAL | 0 refills | Status: AC

## 2022-02-24 NOTE — Progress Notes (Signed)
      Established patient visit  I,[name],acting as a scribe for Gabriel Durie, MD.,have documented all relevant documentation on the behalf of Gabriel Durie, MD,as directed by  Gabriel Durie, MD while in the presence of Gabriel Durie, MD.   Patient: Gabriel Harvey   DOB: 02-03-1961   61 y.o. Male  MRN: [id number] Visit Date: 02/24/2022  Today's healthcare provider: [name], MD   Chief Complaint  Patient presents with  . Sinusitis   Subjective    HPI  Patient has had sinus pain and pressure for 4 days. Patient also has symptoms of slightly productive cough, headaches, ear fullness and sore throat. Patient is not taking any medications for treatment of symptoms. No fever chills or shortness of breath. Medications: Outpatient Medications Prior to Visit  Medication Sig  . amLODipine (NORVASC) 10 MG tablet TAKE ONE TABLET BY MOUTH EVERY DAY  . aspirin EC 81 MG tablet Take 81 mg by mouth daily. As needed  . chlorthalidone (HYGROTON) 25 MG tablet TAKE 1 TABLET BY MOUTH DAILY  . diltiazem (CARDIZEM) 30 MG tablet Take 30 mg by mouth 1 day or 1 dose.  . fexofenadine (ALLEGRA) 180 MG tablet Take 180 mg by mouth daily.  Marland Kitchen gabapentin (NEURONTIN) 100 MG capsule 200 mg 2 (two) times daily.  . metoprolol succinate (TOPROL-XL) 100 MG 24 hr tablet TAKE 1 TABLET BY MOUTH TWICE DAILY . TAKE WITH OR IMMEDIATELY FOLLOWING A MEAL.  . montelukast (SINGULAIR) 10 MG tablet TAKE 1 TABLET BY MOUTH AT BEDTIME  . potassium chloride (KLOR-CON M) 10 MEQ tablet TAKE 1 TABLET BY MOUTH FOUR TIMES DAILY   No facility-administered medications prior to visit.    Review of Systems  {Labs  Heme  Chem  Endocrine  Serology  Results Review (optional):23779}   Objective    BP (!) 161/93 (BP Location: Left Arm, Patient Position: Sitting, Cuff Size: Normal)   Pulse (!) 114   Temp 98.6 F (37 C) (Oral)   Resp 16   Wt 162 lb (73.5 kg)   SpO2 98%   BMI 24.63 kg/m  {Show  previous vital signs (optional):23777}  Physical Exam Vitals reviewed.    ***  No results found for any visits on 02/24/22.  Assessment & Plan     ***  No follow-ups on file.      {provider attestation***:1}   Gabriel Durie, MD  [facility] [phone] (phone) [phone] (fax)  [address]

## 2022-02-24 NOTE — Patient Instructions (Signed)
Get home COVID test. Thank Robitussin twice a day until symptoms resolve.

## 2022-03-03 ENCOUNTER — Other Ambulatory Visit: Payer: Self-pay | Admitting: Family Medicine

## 2022-03-03 DIAGNOSIS — I10 Essential (primary) hypertension: Secondary | ICD-10-CM

## 2022-03-05 ENCOUNTER — Ambulatory Visit (INDEPENDENT_AMBULATORY_CARE_PROVIDER_SITE_OTHER): Payer: 59 | Admitting: Family Medicine

## 2022-03-05 ENCOUNTER — Ambulatory Visit
Admission: RE | Admit: 2022-03-05 | Discharge: 2022-03-05 | Disposition: A | Discharge status: Home / Self Care | Payer: 59 | Source: Ambulatory Visit | Attending: Family Medicine | Admitting: Family Medicine

## 2022-03-05 ENCOUNTER — Ambulatory Visit
Admission: RE | Admit: 2022-03-05 | Discharge: 2022-03-05 | Disposition: A | Discharge status: Home / Self Care | Payer: 59 | Attending: Family Medicine | Admitting: Family Medicine

## 2022-03-05 ENCOUNTER — Ambulatory Visit: Payer: 59 | Admitting: Family Medicine

## 2022-03-05 VITALS — BP 140/68 | HR 90 | Resp 16 | Wt 163.0 lb

## 2022-03-05 DIAGNOSIS — J309 Allergic rhinitis, unspecified: Secondary | ICD-10-CM | POA: Diagnosis not present

## 2022-03-05 DIAGNOSIS — R059 Cough, unspecified: Secondary | ICD-10-CM | POA: Diagnosis not present

## 2022-03-05 DIAGNOSIS — R051 Acute cough: Secondary | ICD-10-CM

## 2022-03-05 DIAGNOSIS — J069 Acute upper respiratory infection, unspecified: Secondary | ICD-10-CM | POA: Diagnosis not present

## 2022-03-05 MED ORDER — HYDROCODONE BIT-HOMATROP MBR 5-1.5 MG/5ML PO SOLN: 5.0000 mL | Freq: Three times a day (TID) | ORAL | 0 refills | Status: AC | PRN

## 2022-03-05 MED ORDER — PREDNISONE 20 MG PO TABS: 20.0000 mg | ORAL_TABLET | Freq: Every day | ORAL | 1 refills | Status: AC

## 2022-03-05 MED ORDER — ALBUTEROL SULFATE HFA 108 (90 BASE) MCG/ACT IN AERS: 2.0000 | INHALATION_SPRAY | Freq: Four times a day (QID) | RESPIRATORY_TRACT | 2 refills | Status: AC | PRN

## 2022-03-05 NOTE — Progress Notes (Deleted)
Complete physical exam   Patient: Gabriel Harvey   DOB: 1961/03/09   61 y.o. Male  MRN: [id number] Visit Date: 03/09/2022  Today's healthcare provider: [name], MD   No chief complaint on file.  Subjective    Gabriel Harvey is a 61 y.o. male who presents today for a complete physical exam.  He reports consuming a {diet types:17450} diet. {Exercise:19826} He generally feels {well/fairly well/poorly:18703}. He reports sleeping {well/fairly well/poorly:18703}. He {does/does not:200015} have additional problems to discuss today.  HPI  ***  Past Medical History:  Diagnosis Date   A-fib [facility])    Allergy    Hypertension    Past Surgical History:  Procedure Laterality Date   CERVICAL FUSION  2015   C2 & C3   COLONOSCOPY WITH PROPOFOL N/A 04/01/2021   Procedure: COLONOSCOPY WITH PROPOFOL;  Surgeon: Virgel Manifold, MD;  Location: [facility] ENDOSCOPY;  Service: Endoscopy;  Laterality: N/A;   VASECTOMY     Social History   Socioeconomic History   Marital status: Divorced    Spouse name: Not on file   Number of children: Not on file   Years of education: Not on file   Highest education level: Not on file  Occupational History   Not on file  Tobacco Use   Smoking status: Never   Smokeless tobacco: Current    Types: Chew  Substance and Sexual Activity   Alcohol use: Yes    Alcohol/week: 2.0 standard drinks of alcohol    Types: 2 Cans of beer per week    Comment: occasional    Drug use: Never   Sexual activity: Not Currently  Other Topics Concern   Not on file  Social History Narrative   Not on file   Social Determinants of Health   Financial Resource Strain: Not on file  Food Insecurity: Not on file  Transportation Needs: Not on file  Physical Activity: Not on file  Stress: Not on file  Social Connections: Not on file  Intimate Partner Violence: Not on file   Family Status  Relation Name Status   Mother  (Not Specified)   Father  (Not Specified)    Mat Uncle  (Not Specified)   MGM  (Not Specified)   MGF  (Not Specified)   PGM  (Not Specified)   Family History  Problem Relation Age of Onset   Pancreatic cancer Mother    COPD Mother    Liver cancer Father    Heart Problems Maternal Uncle    Heart disease Maternal Grandmother    Leukemia Maternal Grandfather    Heart Problems Paternal Grandmother    Allergies  Allergen Reactions   Augmentin [Amoxicillin-Pot Clavulanate] Other (See Comments)    headache   Iodine     Child allergy    Patient Care Team: Jerrol Banana., MD as PCP - General (Family Medicine)   Medications: Outpatient Medications Prior to Visit  Medication Sig   amLODipine (NORVASC) 10 MG tablet TAKE ONE TABLET BY MOUTH EVERY DAY   aspirin EC 81 MG tablet Take 81 mg by mouth daily. As needed   chlorthalidone (HYGROTON) 25 MG tablet TAKE 1 TABLET BY MOUTH DAILY   diltiazem (CARDIZEM) 30 MG tablet Take 30 mg by mouth 1 day or 1 dose.   fexofenadine (ALLEGRA) 180 MG tablet Take 180 mg by mouth daily.   gabapentin (NEURONTIN) 100 MG capsule 200 mg 2 (two) times daily.   metoprolol succinate (TOPROL-XL) 100  MG 24 hr tablet TAKE 1 TABLET BY MOUTH TWICE DAILY . TAKE WITH OR IMMEDIATELY FOLLOWING A MEAL.   montelukast (SINGULAIR) 10 MG tablet TAKE 1 TABLET BY MOUTH AT BEDTIME   potassium chloride (KLOR-CON M) 10 MEQ tablet TAKE 1 TABLET BY MOUTH FOUR TIMES DAILY   sulfamethoxazole-trimethoprim (BACTRIM DS) 800-160 MG tablet Take 1 tablet by mouth 2 (two) times daily.   No facility-administered medications prior to visit.    Review of Systems  All other systems reviewed and are negative.   {Labs  Heme  Chem  Endocrine  Serology  Results Review (optional):23779}  Objective    There were no vitals taken for this visit. {Show previous vital signs (optional):23777}   Physical Exam  ***  Last depression screening scores    11/14/2020    1:30 PM 10/17/2019    3:40 PM  PHQ 2/9 Scores  PHQ - 2  Score 0 0  PHQ- 9 Score 0 0   Last fall risk screening    10/17/2019    3:40 PM  Fall Risk   Falls in the past year? 0  Number falls in past yr: 0  Injury with Fall? 0  Follow up Falls evaluation completed   Last Audit-C alcohol use screening    11/14/2020    1:29 PM  Alcohol Use Disorder Test (AUDIT)  1. How often do you have a drink containing alcohol? 2  2. How many drinks containing alcohol do you have on a typical day when you are drinking? 1  3. How often do you have six or more drinks on one occasion? 1  AUDIT-C Score 4  4. How often during the last year have you found that you were not able to stop drinking once you had started? 0  5. How often during the last year have you failed to do what was normally expected from you because of drinking? 0  6. How often during the last year have you needed a first drink in the morning to get yourself going after a heavy drinking session? 0  7. How often during the last year have you had a feeling of guilt of remorse after drinking? 0  8. How often during the last year have you been unable to remember what happened the night before because you had been drinking? 0  9. Have you or someone else been injured as a result of your drinking? 0  10. Has a relative or friend or a doctor or another health worker been concerned about your drinking or suggested you cut down? 0  Alcohol Use Disorder Identification Test Final Score (AUDIT) 4  Alcohol Brief Interventions/Follow-up AUDIT Score <7 follow-up not indicated   A score of 3 or more in women, and 4 or more in men indicates increased risk for alcohol abuse, EXCEPT if all of the points are from question 1   No results found for any visits on 03/09/22.  Assessment & Plan    Routine Health Maintenance and Physical Exam  Exercise Activities and Dietary recommendations  Goals   None     There is no immunization history for the selected administration types on file for this patient.  Health  Maintenance  Topic Date Due   HIV Screening  Never done   Hepatitis C Screening  Never done   TETANUS/TDAP  Never done   Zoster Vaccines- Shingrix (1 of 2) Never done   INFLUENZA VACCINE  04/14/2022   COLONOSCOPY (Pts 45-66yr Insurance coverage will  need to be confirmed)  04/01/2024   HPV VACCINES  Aged Out    Discussed health benefits of physical activity, and encouraged him to engage in regular exercise appropriate for his age and condition.  ***  No follow-ups on file.     {provider attestation***:1}   [name], MD  [facility] [phone] (phone) [phone] (fax)  [place]

## 2022-03-05 NOTE — Progress Notes (Unsigned)
      Established patient visit   Patient: Gabriel Harvey   DOB: [birth year]   61 y.o. Male  MRN: [id number] Visit Date: 03/05/2022  Today's healthcare provider: [name], MD   Chief Complaint  Patient presents with   Cough   Subjective    HPI  Patient was seen in office 02/24/2022 for sinuitis. Patient was given antibiotic, which he has completed. Patient's symptoms have worsened into cough and sore throat. Cough is dry and non-productive. Patient states he coughs constantly and has become very hoarse.   Medications: Outpatient Medications Prior to Visit  Medication Sig   amLODipine (NORVASC) 10 MG tablet TAKE ONE TABLET BY MOUTH EVERY DAY   aspirin EC 81 MG tablet Take 81 mg by mouth daily. As needed   chlorthalidone (HYGROTON) 25 MG tablet TAKE 1 TABLET BY MOUTH DAILY   diltiazem (CARDIZEM) 30 MG tablet Take 30 mg by mouth 1 day or 1 dose.   fexofenadine (ALLEGRA) 180 MG tablet Take 180 mg by mouth daily.   gabapentin (NEURONTIN) 100 MG capsule 200 mg 2 (two) times daily.   metoprolol succinate (TOPROL-XL) 100 MG 24 hr tablet TAKE 1 TABLET BY MOUTH TWICE DAILY . TAKE WITH OR IMMEDIATELY FOLLOWING A MEAL.   montelukast (SINGULAIR) 10 MG tablet TAKE 1 TABLET BY MOUTH AT BEDTIME   potassium chloride (KLOR-CON M) 10 MEQ tablet TAKE 1 TABLET BY MOUTH FOUR TIMES DAILY   sulfamethoxazole-trimethoprim (BACTRIM DS) 800-160 MG tablet Take 1 tablet by mouth 2 (two) times daily. (Patient not taking: Reported on 03/05/2022)   No facility-administered medications prior to visit.    Review of Systems  {Labs  Heme  Chem  Endocrine  Serology  Results Review (optional):23779}   Objective    BP 140/68 (BP Location: Right Arm, Patient Position: Sitting, Cuff Size: Normal)   Pulse 90   Resp 16   Wt 163 lb (73.9 kg)   SpO2 97%   BMI 24.78 kg/m  {Show previous vital signs (optional):23777}  Physical Exam  ***  No results found for any visits on 03/05/22.  Assessment & Plan      ***  No follow-ups on file.      {provider attestation***:1}   [name], MD  [facility] [phone] (phone) [phone] (fax)  [place]

## 2022-03-06 ENCOUNTER — Telehealth: Payer: Self-pay

## 2022-03-06 NOTE — Telephone Encounter (Signed)
Copied from [place] [id number]. Topic: General - Call Back - No Documentation >> Mar 06, 2022  9:58 AM Sabas Sous wrote: Reason for CRM: Pt wants to speak to Arbie Cookey, says this is regarding his appt on Monday.  Best contact: [phone]

## 2022-03-09 ENCOUNTER — Ambulatory Visit: Payer: 59 | Admitting: Family Medicine

## 2022-03-09 NOTE — Progress Notes (Signed)
Complete physical exam  I,Gabriel Harvey,acting as a scribe for Gabriel Durie, MD.,have documented all relevant documentation on the behalf of Gabriel Durie, MD,as directed by  Gabriel Durie, MD while in the presence of Gabriel Durie, MD.   Patient: Gabriel Harvey   DOB: 05/04/1961   61 y.o. Male  MRN: [id number] Visit Date: 03/10/2022  Today's healthcare provider: [name], MD   Chief Complaint  Patient presents with   Annual Exam   Subjective    Gabriel Harvey is a 61 y.o. male who presents today for a complete physical exam.  He reports consuming a general diet. The patient does not participate in regular exercise at present. He generally feels fairly well. He reports sleeping fairly well. He does not have additional problems to discuss today.  HPI  He has right much better from recent URI with severe cough, this is resolved. Past Medical History:  Diagnosis Date   A-fib [facility])    Allergy    Hypertension    Past Surgical History:  Procedure Laterality Date   CERVICAL FUSION  2015   C2 & C3   COLONOSCOPY WITH PROPOFOL N/A 04/01/2021   Procedure: COLONOSCOPY WITH PROPOFOL;  Surgeon: Virgel Manifold, MD;  Location: [facility] ENDOSCOPY;  Service: Endoscopy;  Laterality: N/A;   VASECTOMY     Social History   Socioeconomic History   Marital status: Divorced    Spouse name: Not on file   Number of children: Not on file   Years of education: Not on file   Highest education level: Not on file  Occupational History   Not on file  Tobacco Use   Smoking status: Never   Smokeless tobacco: Current    Types: Chew  Substance and Sexual Activity   Alcohol use: Yes    Alcohol/week: 2.0 standard drinks of alcohol    Types: 2 Cans of beer per week    Comment: occasional    Drug use: Never   Sexual activity: Not Currently  Other Topics Concern   Not on file  Social History Narrative   Not on file   Social Determinants of Health   Financial  Resource Strain: Not on file  Food Insecurity: Not on file  Transportation Needs: Not on file  Physical Activity: Not on file  Stress: Not on file  Social Connections: Not on file  Intimate Partner Violence: Not on file   Family Status  Relation Name Status   Mother  (Not Specified)   Father  (Not Specified)   Mat Uncle  (Not Specified)   MGM  (Not Specified)   MGF  (Not Specified)   PGM  (Not Specified)   Family History  Problem Relation Age of Onset   Pancreatic cancer Mother    COPD Mother    Liver cancer Father    Heart Problems Maternal Uncle    Heart disease Maternal Grandmother    Leukemia Maternal Grandfather    Heart Problems Paternal Grandmother    Allergies  Allergen Reactions   Augmentin [Amoxicillin-Pot Clavulanate] Other (See Comments)    headache   Iodine     Child allergy    Patient Care Team: Gabriel Harvey., MD as PCP - General (Family Medicine)   Medications: Outpatient Medications Prior to Visit  Medication Sig   albuterol (VENTOLIN HFA) 108 (90 Base) MCG/ACT inhaler Inhale 2 puffs into the lungs every 6 (six) hours as needed for wheezing or shortness  of breath.   amLODipine (NORVASC) 10 MG tablet TAKE ONE TABLET BY MOUTH EVERY DAY   aspirin EC 81 MG tablet Take 81 mg by mouth daily. As needed   chlorthalidone (HYGROTON) 25 MG tablet TAKE 1 TABLET BY MOUTH DAILY   diltiazem (CARDIZEM) 30 MG tablet Take 30 mg by mouth 1 day or 1 dose.   fexofenadine (ALLEGRA) 180 MG tablet Take 180 mg by mouth daily.   gabapentin (NEURONTIN) 100 MG capsule 200 mg 2 (two) times daily.   HYDROcodone bit-homatropine (HYCODAN) 5-1.5 MG/5ML syrup Take 5 mLs by mouth every 8 (eight) hours as needed for cough.   metoprolol succinate (TOPROL-XL) 100 MG 24 hr tablet TAKE 1 TABLET BY MOUTH TWICE DAILY . TAKE WITH OR IMMEDIATELY FOLLOWING A MEAL.   montelukast (SINGULAIR) 10 MG tablet TAKE 1 TABLET BY MOUTH AT BEDTIME   potassium chloride (KLOR-CON M) 10 MEQ tablet  TAKE 1 TABLET BY MOUTH FOUR TIMES DAILY   predniSONE (DELTASONE) 20 MG tablet Take 1 tablet (20 mg total) by mouth daily with breakfast.   sulfamethoxazole-trimethoprim (BACTRIM DS) 800-160 MG tablet Take 1 tablet by mouth 2 (two) times daily.   No facility-administered medications prior to visit.    Review of Systems  All other systems reviewed and are negative.   Last lipids Lab Results  Component Value Date   CHOL 160 01/24/2021   HDL 84 01/24/2021   LDLCALC 63 01/24/2021   TRIG 66 01/24/2021   CHOLHDL 1.9 01/24/2021      Objective     BP 130/70 (BP Location: Right Arm, Patient Position: Sitting, Cuff Size: Normal)   Pulse 87   Resp 16   Ht '5\' 8"'$  (1.727 m)   Wt 160 lb (72.6 kg)   SpO2 97%   BMI 24.33 kg/m  BP Readings from Last 3 Encounters:  03/10/22 130/70  03/05/22 140/68  02/24/22 (!) 161/93   Wt Readings from Last 3 Encounters:  03/10/22 160 lb (72.6 kg)  03/05/22 163 lb (73.9 kg)  02/24/22 162 lb (73.5 kg)       Physical Exam Vitals reviewed.  HENT:     Head: Normocephalic and atraumatic.     Right Ear: External ear normal.     Left Ear: External ear normal.  Eyes:     General: No scleral icterus.    Conjunctiva/sclera: Conjunctivae normal.  Neck:     Vascular: No carotid bruit.  Cardiovascular:     Rate and Rhythm: Normal rate and regular rhythm.     Pulses: Normal pulses.     Heart sounds: Normal heart sounds.  Pulmonary:     Effort: Pulmonary effort is normal.     Breath sounds: Normal breath sounds.  Abdominal:     Palpations: Abdomen is soft.     Comments: Small umbilical hernia  Genitourinary:    Penis: Normal.      Testes: Normal.  Musculoskeletal:     Right lower leg: No edema.     Left lower leg: No edema.  Lymphadenopathy:     Cervical: No cervical adenopathy.  Skin:    General: Skin is warm and dry.  Neurological:     General: No focal deficit present.     Mental Status: He is alert and oriented to person, place, and  time.  Psychiatric:        Mood and Affect: Mood normal.        Behavior: Behavior normal.        Thought  Content: Thought content normal.        Judgment: Judgment normal.       Last depression screening scores    03/10/2022    3:10 PM 11/14/2020    1:30 PM 10/17/2019    3:40 PM  PHQ 2/9 Scores  PHQ - 2 Score 2 0 0  PHQ- 9 Score 5 0 0   Last fall risk screening    03/10/2022    3:10 PM  [place] in the past year? 0  Number falls in past yr: 0  Injury with Fall? 0  Risk for fall due to : No Fall Risks  Follow up Falls evaluation completed   Last Audit-C alcohol use screening    03/10/2022    3:10 PM  Alcohol Use Disorder Test (AUDIT)  1. How often do you have a drink containing alcohol? 2  2. How many drinks containing alcohol do you have on a typical day when you are drinking? 1  3. How often do you have six or more drinks on one occasion? 1  AUDIT-C Score 4  4. How often during the last year have you found that you were not able to stop drinking once you had started? 0  5. How often during the last year have you failed to do what was normally expected from you because of drinking? 0  6. How often during the last year have you needed a first drink in the morning to get yourself going after a heavy drinking session? 0  7. How often during the last year have you had a feeling of guilt of remorse after drinking? 0  8. How often during the last year have you been unable to remember what happened the night before because you had been drinking? 0  9. Have you or someone else been injured as a result of your drinking? 0  10. Has a relative or friend or a doctor or another health worker been concerned about your drinking or suggested you cut down? 0  Alcohol Use Disorder Identification Test Final Score (AUDIT) 4   A score of 3 or more in women, and 4 or more in men indicates increased risk for alcohol abuse, EXCEPT if all of the points are from question 1   No results  found for any visits on 03/10/22.  Assessment & Plan    Routine Health Maintenance and Physical Exam  Exercise Activities and Dietary recommendations  Goals   None     Immunization History  Administered Date(s) Administered   Janssen (J&J) SARS-COV-2 Vaccination 04/06/2020   Zoster Recombinat (Shingrix) 03/10/2022    Health Maintenance  Topic Date Due   HIV Screening  Never done   Hepatitis C Screening  Never done   TETANUS/TDAP  Never done   COVID-19 Vaccine (2 - Janssen risk series) 05/04/2020   INFLUENZA VACCINE  04/14/2022   Zoster Vaccines- Shingrix (2 of 2) 05/05/2022   COLONOSCOPY (Pts 45-75yr Insurance coverage will need to be confirmed)  04/01/2024   HPV VACCINES  Aged Out    Discussed health benefits of physical activity, and encouraged him to engage in regular exercise appropriate for his age and condition.  1. Annual physical exam On next lab consider getting a cortisol level due to patient having a very tanned/bronze appearance - Lipid panel - CBC w/Diff/Platelet - Comprehensive Metabolic Panel (CMET) - TSH  2. Essential hypertension  - Lipid panel - CBC w/Diff/Platelet - Comprehensive Metabolic Panel (CMET) -  TSH  3. Atrial fibrillation, unspecified type (HCC)  - Lipid panel - CBC w/Diff/Platelet - Comprehensive Metabolic Panel (CMET) - TSH  4. Parkinson's disease ([place]) Just noted today right arm tremor that could be early Parkinson's.  He is already followed by neurology for gait disturbance.  Follow-up routinely as he is having no progressive problems at all.  - Lipid panel - CBC w/Diff/Platelet - Comprehensive Metabolic Panel (CMET) - TSH  5. Hypokalemia  - Lipid panel - CBC w/Diff/Platelet - Comprehensive Metabolic Panel (CMET) - TSH  6. Allergic rhinitis, unspecified seasonality, unspecified trigger  - Lipid panel - CBC w/Diff/Platelet - Comprehensive Metabolic Panel (CMET) - TSH  7. Screening for prostate cancer  -  PSA  8. Need for shingles vaccine  - Varicella-zoster vaccine IM (Shingrix)   Return in about 6 months (around 09/21/2022).     I, Gabriel Durie, MD, have reviewed all documentation for this visit. The documentation on 03/11/22 for the exam, diagnosis, procedures, and orders are all accurate and complete.    Constantine Ruddick Cranford Mon, MD  [facility] [phone] (phone) [phone] (fax)  [place]

## 2022-03-10 ENCOUNTER — Ambulatory Visit (INDEPENDENT_AMBULATORY_CARE_PROVIDER_SITE_OTHER): Payer: 59 | Admitting: Family Medicine

## 2022-03-10 ENCOUNTER — Encounter: Payer: Self-pay | Admitting: Family Medicine

## 2022-03-10 VITALS — BP 130/70 | HR 87 | Resp 16 | Ht 68.0 in | Wt 160.0 lb

## 2022-03-10 DIAGNOSIS — E876 Hypokalemia: Secondary | ICD-10-CM

## 2022-03-10 DIAGNOSIS — I4891 Unspecified atrial fibrillation: Secondary | ICD-10-CM | POA: Diagnosis not present

## 2022-03-10 DIAGNOSIS — G20 Parkinson's disease: Secondary | ICD-10-CM | POA: Diagnosis not present

## 2022-03-10 DIAGNOSIS — J309 Allergic rhinitis, unspecified: Secondary | ICD-10-CM | POA: Diagnosis not present

## 2022-03-10 DIAGNOSIS — Z0000 Encounter for general adult medical examination without abnormal findings: Secondary | ICD-10-CM

## 2022-03-10 DIAGNOSIS — Z125 Encounter for screening for malignant neoplasm of prostate: Secondary | ICD-10-CM

## 2022-03-10 DIAGNOSIS — Z23 Encounter for immunization: Secondary | ICD-10-CM | POA: Diagnosis not present

## 2022-03-10 DIAGNOSIS — I10 Essential (primary) hypertension: Secondary | ICD-10-CM

## 2022-03-24 DIAGNOSIS — Z125 Encounter for screening for malignant neoplasm of prostate: Secondary | ICD-10-CM | POA: Diagnosis not present

## 2022-03-24 DIAGNOSIS — I10 Essential (primary) hypertension: Secondary | ICD-10-CM | POA: Diagnosis not present

## 2022-03-24 DIAGNOSIS — J309 Allergic rhinitis, unspecified: Secondary | ICD-10-CM | POA: Diagnosis not present

## 2022-03-24 DIAGNOSIS — Z0000 Encounter for general adult medical examination without abnormal findings: Secondary | ICD-10-CM | POA: Diagnosis not present

## 2022-03-24 DIAGNOSIS — G20 Parkinson's disease: Secondary | ICD-10-CM | POA: Diagnosis not present

## 2022-03-24 DIAGNOSIS — I4891 Unspecified atrial fibrillation: Secondary | ICD-10-CM | POA: Diagnosis not present

## 2022-03-24 DIAGNOSIS — E876 Hypokalemia: Secondary | ICD-10-CM | POA: Diagnosis not present

## 2022-03-25 LAB — CBC WITH DIFFERENTIAL/PLATELET
Basophils Absolute: 0.1 10*3/uL (ref 0.0–0.2)
Basos: 1 %
EOS (ABSOLUTE): 0 10*3/uL (ref 0.0–0.4)
Eos: 1 %
Hematocrit: 40.8 % (ref 37.5–51.0)
Hemoglobin: 14.6 g/dL (ref 13.0–17.7)
Immature Grans (Abs): 0 10*3/uL (ref 0.0–0.1)
Immature Granulocytes: 0 %
Lymphocytes Absolute: 1.1 10*3/uL (ref 0.7–3.1)
Lymphs: 18 %
MCH: 33 pg (ref 26.6–33.0)
MCHC: 35.8 g/dL — ABNORMAL HIGH (ref 31.5–35.7)
MCV: 92 fL (ref 79–97)
Monocytes Absolute: 0.8 10*3/uL (ref 0.1–0.9)
Monocytes: 13 %
Neutrophils Absolute: 4.2 10*3/uL (ref 1.4–7.0)
Neutrophils: 67 %
Platelets: 133 10*3/uL — ABNORMAL LOW (ref 150–450)
RBC: 4.42 x10E6/uL (ref 4.14–5.80)
RDW: 12.1 % (ref 11.6–15.4)
WBC: 6.2 10*3/uL (ref 3.4–10.8)

## 2022-03-25 LAB — LIPID PANEL
Chol/HDL Ratio: 2.1 ratio (ref 0.0–5.0)
Cholesterol, Total: 154 mg/dL (ref 100–199)
HDL: 75 mg/dL (ref >39)
LDL Chol Calc (NIH): 67 mg/dL (ref 0–99)
Triglycerides: 57 mg/dL (ref 0–149)
VLDL Cholesterol Cal: 12 mg/dL (ref 5–40)

## 2022-03-25 LAB — COMPREHENSIVE METABOLIC PANEL
ALT: 24 IU/L (ref 0–44)
AST: 44 IU/L — ABNORMAL HIGH (ref 0–40)
Albumin/Globulin Ratio: 1.6 (ref 1.2–2.2)
Albumin: 4.5 g/dL (ref 3.9–4.9)
Alkaline Phosphatase: 111 IU/L (ref 44–121)
BUN/Creatinine Ratio: 10 (ref 10–24)
BUN: 10 mg/dL (ref 8–27)
Bilirubin Total: 2.5 mg/dL — ABNORMAL HIGH (ref 0.0–1.2)
CO2: 26 mmol/L (ref 20–29)
Calcium: 9.5 mg/dL (ref 8.6–10.2)
Chloride: 83 mmol/L — ABNORMAL LOW (ref 96–106)
Creatinine, Ser: 1.05 mg/dL (ref 0.76–1.27)
Globulin, Total: 2.8 g/dL (ref 1.5–4.5)
Glucose: 93 mg/dL (ref 70–99)
Potassium: 2.9 mmol/L — ABNORMAL LOW (ref 3.5–5.2)
Sodium: 127 mmol/L — ABNORMAL LOW (ref 134–144)
Total Protein: 7.3 g/dL (ref 6.0–8.5)
eGFR: 80 mL/min/{1.73_m2} (ref >59)

## 2022-03-25 LAB — PSA: Prostate Specific Ag, Serum: 1.1 ng/mL (ref 0.0–4.0)

## 2022-03-25 LAB — TSH: TSH: 4.11 u[IU]/mL (ref 0.450–4.500)

## 2022-05-11 ENCOUNTER — Ambulatory Visit (INDEPENDENT_AMBULATORY_CARE_PROVIDER_SITE_OTHER): Payer: 59 | Admitting: Family Medicine

## 2022-05-11 DIAGNOSIS — Z23 Encounter for immunization: Secondary | ICD-10-CM

## 2022-05-11 NOTE — Progress Notes (Signed)
Shot only 

## 2022-06-01 DIAGNOSIS — R251 Tremor, unspecified: Secondary | ICD-10-CM | POA: Diagnosis not present

## 2022-06-01 DIAGNOSIS — M2560 Stiffness of unspecified joint, not elsewhere classified: Secondary | ICD-10-CM | POA: Diagnosis not present

## 2022-06-01 DIAGNOSIS — M542 Cervicalgia: Secondary | ICD-10-CM | POA: Diagnosis not present

## 2022-06-01 DIAGNOSIS — R2681 Unsteadiness on feet: Secondary | ICD-10-CM | POA: Diagnosis not present

## 2022-06-01 DIAGNOSIS — M5412 Radiculopathy, cervical region: Secondary | ICD-10-CM | POA: Diagnosis not present

## 2022-06-09 DIAGNOSIS — H0015 Chalazion left lower eyelid: Secondary | ICD-10-CM | POA: Diagnosis not present

## 2022-06-23 DIAGNOSIS — H524 Presbyopia: Secondary | ICD-10-CM | POA: Diagnosis not present

## 2022-08-24 ENCOUNTER — Other Ambulatory Visit: Payer: Self-pay | Admitting: Family Medicine

## 2022-08-24 DIAGNOSIS — E876 Hypokalemia: Secondary | ICD-10-CM

## 2022-08-24 NOTE — Telephone Encounter (Signed)
[facility] faxed refill request for the following medications:  potassium chloride (KLOR-CON M) 10 MEQ tablet    Please advise.

## 2022-08-26 MED ORDER — POTASSIUM CHLORIDE CRYS ER 10 MEQ PO TBCR: 10.0000 meq | EXTENDED_RELEASE_TABLET | Freq: Four times a day (QID) | ORAL | 1 refills | Status: AC

## 2022-09-17 ENCOUNTER — Telehealth: Payer: Self-pay | Admitting: Family Medicine

## 2022-09-17 NOTE — Telephone Encounter (Signed)
Copied from [place] [id number]. Topic: General - Other >> Sep 17, 2022  9:30 AM Chapman Fitch wrote: Reason for CRM: Pt will be transferring to Dr. Rosanna Randy and stated he will not be a [facility] pt/ please advise

## 2022-09-21 ENCOUNTER — Ambulatory Visit: Payer: 59 | Admitting: Family Medicine

## 2022-10-01 DIAGNOSIS — R2681 Unsteadiness on feet: Secondary | ICD-10-CM | POA: Diagnosis not present

## 2022-10-01 DIAGNOSIS — R251 Tremor, unspecified: Secondary | ICD-10-CM | POA: Diagnosis not present

## 2022-10-01 DIAGNOSIS — M25562 Pain in left knee: Secondary | ICD-10-CM | POA: Diagnosis not present

## 2022-10-01 DIAGNOSIS — M542 Cervicalgia: Secondary | ICD-10-CM | POA: Diagnosis not present

## 2022-10-01 DIAGNOSIS — G8929 Other chronic pain: Secondary | ICD-10-CM | POA: Diagnosis not present

## 2022-10-01 DIAGNOSIS — M5412 Radiculopathy, cervical region: Secondary | ICD-10-CM | POA: Diagnosis not present

## 2022-10-01 DIAGNOSIS — M2560 Stiffness of unspecified joint, not elsewhere classified: Secondary | ICD-10-CM | POA: Diagnosis not present

## 2022-11-18 DIAGNOSIS — M542 Cervicalgia: Secondary | ICD-10-CM | POA: Diagnosis not present

## 2022-11-18 DIAGNOSIS — G8929 Other chronic pain: Secondary | ICD-10-CM | POA: Diagnosis not present

## 2022-11-18 DIAGNOSIS — M5412 Radiculopathy, cervical region: Secondary | ICD-10-CM | POA: Diagnosis not present

## 2022-11-18 DIAGNOSIS — M25562 Pain in left knee: Secondary | ICD-10-CM | POA: Diagnosis not present

## 2022-12-14 ENCOUNTER — Other Ambulatory Visit: Payer: Self-pay | Admitting: Family Medicine

## 2022-12-14 DIAGNOSIS — E876 Hypokalemia: Secondary | ICD-10-CM

## 2023-01-18 DIAGNOSIS — M5412 Radiculopathy, cervical region: Secondary | ICD-10-CM | POA: Diagnosis not present

## 2023-01-18 DIAGNOSIS — M542 Cervicalgia: Secondary | ICD-10-CM | POA: Diagnosis not present

## 2023-01-18 DIAGNOSIS — G8929 Other chronic pain: Secondary | ICD-10-CM | POA: Diagnosis not present

## 2023-01-18 DIAGNOSIS — M25562 Pain in left knee: Secondary | ICD-10-CM | POA: Diagnosis not present

## 2023-01-20 DIAGNOSIS — M542 Cervicalgia: Secondary | ICD-10-CM | POA: Diagnosis not present

## 2023-01-20 DIAGNOSIS — M5412 Radiculopathy, cervical region: Secondary | ICD-10-CM | POA: Diagnosis not present

## 2023-03-11 DIAGNOSIS — K429 Umbilical hernia without obstruction or gangrene: Secondary | ICD-10-CM | POA: Diagnosis not present

## 2023-03-11 DIAGNOSIS — I10 Essential (primary) hypertension: Secondary | ICD-10-CM | POA: Diagnosis not present

## 2023-03-11 DIAGNOSIS — Z0000 Encounter for general adult medical examination without abnormal findings: Secondary | ICD-10-CM | POA: Diagnosis not present

## 2023-03-11 DIAGNOSIS — E7800 Pure hypercholesterolemia, unspecified: Secondary | ICD-10-CM | POA: Diagnosis not present

## 2023-03-11 DIAGNOSIS — M5412 Radiculopathy, cervical region: Secondary | ICD-10-CM | POA: Diagnosis not present

## 2023-03-11 DIAGNOSIS — H9313 Tinnitus, bilateral: Secondary | ICD-10-CM | POA: Diagnosis not present

## 2023-05-24 DIAGNOSIS — G8929 Other chronic pain: Secondary | ICD-10-CM | POA: Diagnosis not present

## 2023-05-24 DIAGNOSIS — M25562 Pain in left knee: Secondary | ICD-10-CM | POA: Diagnosis not present

## 2023-05-24 DIAGNOSIS — M542 Cervicalgia: Secondary | ICD-10-CM | POA: Diagnosis not present

## 2023-05-24 DIAGNOSIS — M5412 Radiculopathy, cervical region: Secondary | ICD-10-CM | POA: Diagnosis not present

## 2023-05-24 DIAGNOSIS — R251 Tremor, unspecified: Secondary | ICD-10-CM | POA: Diagnosis not present

## 2023-06-11 DIAGNOSIS — R7989 Other specified abnormal findings of blood chemistry: Secondary | ICD-10-CM | POA: Diagnosis not present

## 2023-06-11 DIAGNOSIS — E871 Hypo-osmolality and hyponatremia: Secondary | ICD-10-CM | POA: Diagnosis not present

## 2023-06-11 DIAGNOSIS — E7800 Pure hypercholesterolemia, unspecified: Secondary | ICD-10-CM | POA: Diagnosis not present

## 2023-06-11 DIAGNOSIS — M5412 Radiculopathy, cervical region: Secondary | ICD-10-CM | POA: Diagnosis not present

## 2023-06-11 DIAGNOSIS — H9313 Tinnitus, bilateral: Secondary | ICD-10-CM | POA: Diagnosis not present

## 2023-06-11 DIAGNOSIS — I10 Essential (primary) hypertension: Secondary | ICD-10-CM | POA: Diagnosis not present

## 2023-09-30 IMAGING — CR DG CHEST 2V
1 series · 2 of 2 positions shown · non-contrast
Comparison: Chest radiograph 03/28/2014

CLINICAL DATA: Productive cough.

EXAM:
CHEST - 2 VIEW

[Series 1: dg chest 2 view · 0.14mm/px · 2 of 2 slices shown]
[im 1/2]
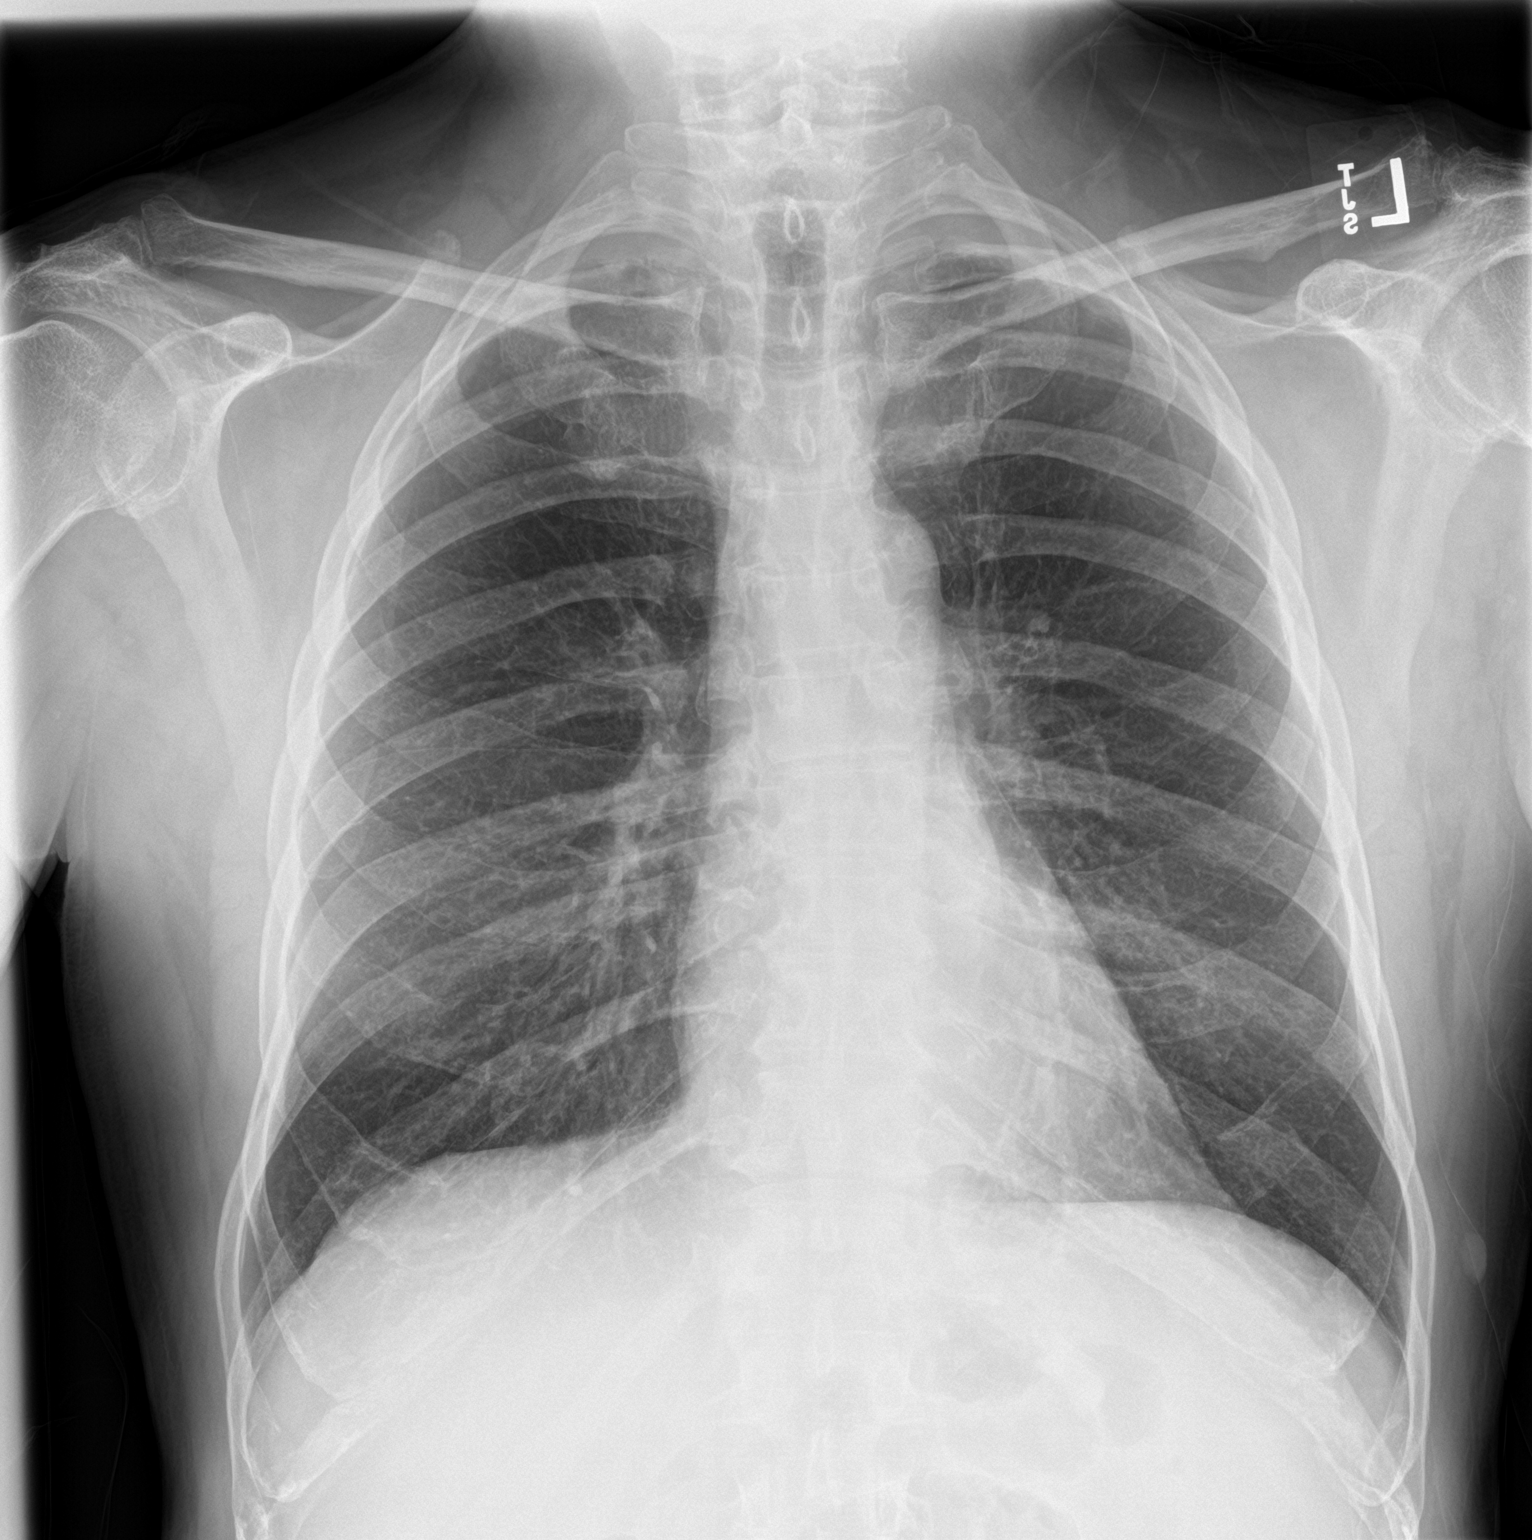
[im 2/2]
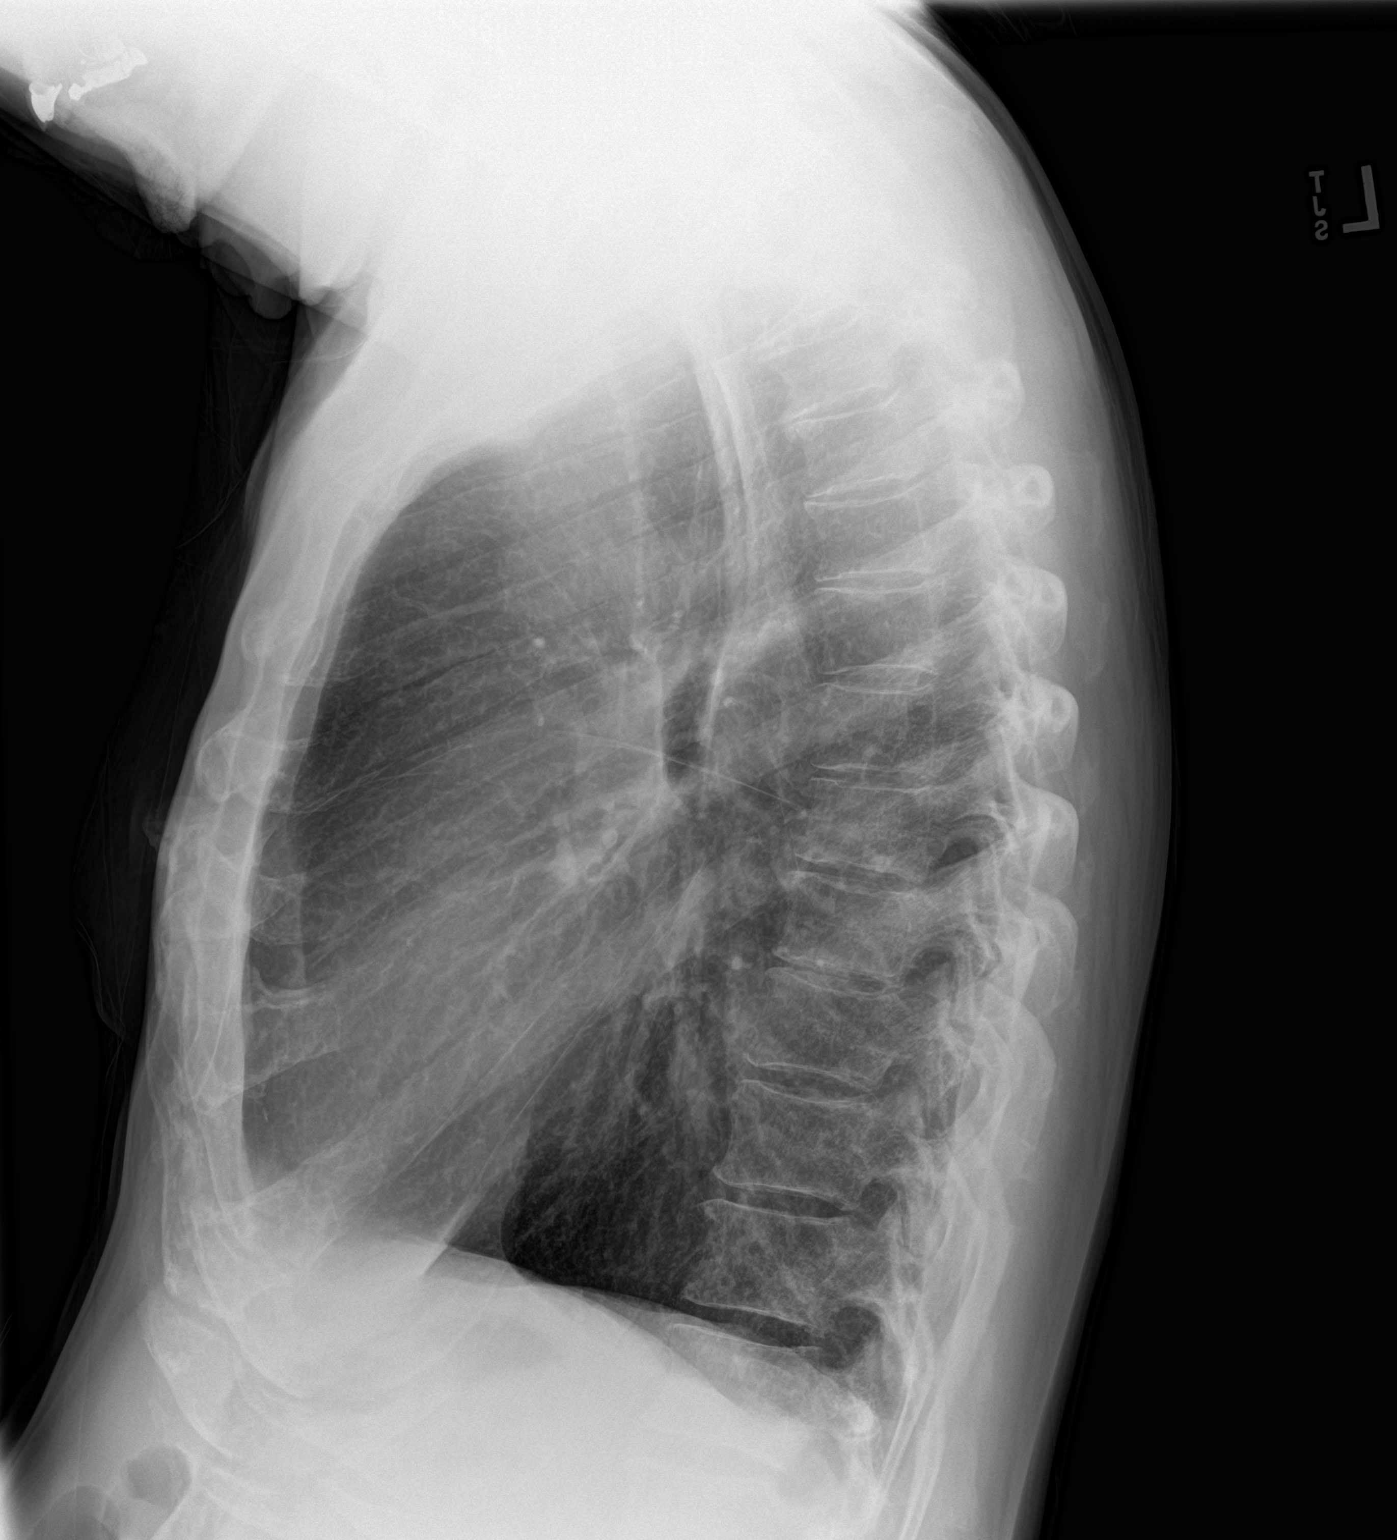

[2 of 2 positions shown; findings below may reference images not displayed]

FINDINGS: The heart size and mediastinal contours are within normal limits.
Both lungs are clear. The visualized skeletal structures are
unremarkable.
IMPRESSION: No active cardiopulmonary disease.
